# Patient Record
Sex: Male | Born: 1975 | Race: White | Hispanic: No | Marital: Married | Smoking: Never smoker
Health system: Southern US, Community
[De-identification: ages and names within clinical notes are randomized; demographics above are authoritative.]

## PROBLEM LIST (undated history)

## (undated) ENCOUNTER — Emergency Department (HOSPITAL_COMMUNITY): Discharge status: Absent without leave | Payer: 59

## (undated) DIAGNOSIS — B9681 Helicobacter pylori [H. pylori] as the cause of diseases classified elsewhere: Secondary | ICD-10-CM

## (undated) DIAGNOSIS — K859 Acute pancreatitis without necrosis or infection, unspecified: Secondary | ICD-10-CM

## (undated) DIAGNOSIS — Z87898 Personal history of other specified conditions: Secondary | ICD-10-CM

## (undated) DIAGNOSIS — J189 Pneumonia, unspecified organism: Secondary | ICD-10-CM

## (undated) DIAGNOSIS — K279 Peptic ulcer, site unspecified, unspecified as acute or chronic, without hemorrhage or perforation: Secondary | ICD-10-CM

## (undated) DIAGNOSIS — E781 Pure hyperglyceridemia: Secondary | ICD-10-CM

## (undated) DIAGNOSIS — K5792 Diverticulitis of intestine, part unspecified, without perforation or abscess without bleeding: Secondary | ICD-10-CM

## (undated) DIAGNOSIS — E785 Hyperlipidemia, unspecified: Secondary | ICD-10-CM

## (undated) DIAGNOSIS — E119 Type 2 diabetes mellitus without complications: Secondary | ICD-10-CM

## (undated) HISTORY — DX: Personal history of other specified conditions: Z87.898

## (undated) HISTORY — DX: Pneumonia, unspecified organism: J18.9

## (undated) HISTORY — DX: Type 2 diabetes mellitus without complications: E11.9

## (undated) HISTORY — DX: Hyperlipidemia, unspecified: E78.5

## (undated) HISTORY — DX: Pure hyperglyceridemia: E78.1

## (undated) HISTORY — DX: Acute pancreatitis without necrosis or infection, unspecified: K85.90

## (undated) HISTORY — PX: BACK SURGERY: SHX140

## (undated) HISTORY — PX: COLON SURGERY: SHX602

---

## 2003-06-30 ENCOUNTER — Encounter: Payer: Self-pay | Admitting: Family Medicine

## 2003-06-30 ENCOUNTER — Ambulatory Visit (HOSPITAL_COMMUNITY): Admission: RE | Admit: 2003-06-30 | Discharge: 2003-06-30 | Payer: Self-pay | Admitting: Family Medicine

## 2003-07-23 ENCOUNTER — Ambulatory Visit (HOSPITAL_COMMUNITY): Admission: RE | Admit: 2003-07-23 | Discharge: 2003-07-24 | Payer: Self-pay | Admitting: Neurosurgery

## 2007-05-25 ENCOUNTER — Emergency Department (HOSPITAL_COMMUNITY): Admission: EM | Admit: 2007-05-25 | Discharge: 2007-05-25 | Payer: Self-pay | Admitting: Emergency Medicine

## 2009-10-08 ENCOUNTER — Emergency Department (HOSPITAL_COMMUNITY): Admission: EM | Admit: 2009-10-08 | Discharge: 2009-10-09 | Payer: Self-pay | Admitting: Emergency Medicine

## 2009-10-08 ENCOUNTER — Emergency Department (HOSPITAL_COMMUNITY): Admission: EM | Admit: 2009-10-08 | Discharge: 2009-10-08 | Payer: Self-pay | Admitting: Emergency Medicine

## 2010-12-05 LAB — URINALYSIS, ROUTINE W REFLEX MICROSCOPIC
Bilirubin Urine: NEGATIVE
Bilirubin Urine: NEGATIVE
Glucose, UA: NEGATIVE mg/dL
Glucose, UA: NEGATIVE mg/dL
Hgb urine dipstick: NEGATIVE
Hgb urine dipstick: NEGATIVE
Ketones, ur: NEGATIVE mg/dL
Nitrite: NEGATIVE
Protein, ur: NEGATIVE mg/dL
Specific Gravity, Urine: 1.013 (ref 1.005–1.030)
Specific Gravity, Urine: 1.022 (ref 1.005–1.030)
Urobilinogen, UA: 0.2 mg/dL (ref 0.0–1.0)
pH: 5.5 (ref 5.0–8.0)
pH: 6 (ref 5.0–8.0)

## 2010-12-05 LAB — DIFFERENTIAL
Eosinophils Absolute: 0.1 10*3/uL (ref 0.0–0.7)
Eosinophils Absolute: 0.1 10*3/uL (ref 0.0–0.7)
Eosinophils Relative: 1 % (ref 0–5)
Lymphocytes Relative: 21 % (ref 12–46)
Lymphs Abs: 1.9 10*3/uL (ref 0.7–4.0)
Lymphs Abs: 2 10*3/uL (ref 0.7–4.0)
Monocytes Absolute: 0.5 10*3/uL (ref 0.1–1.0)
Neutrophils Relative %: 70 % (ref 43–77)

## 2010-12-05 LAB — COMPREHENSIVE METABOLIC PANEL
ALT: 29 U/L (ref 0–53)
ALT: 32 U/L (ref 0–53)
AST: 36 U/L (ref 0–37)
Albumin: 4.2 g/dL (ref 3.5–5.2)
BUN: 11 mg/dL (ref 6–23)
CO2: 29 mEq/L (ref 19–32)
Calcium: 8.8 mg/dL (ref 8.4–10.5)
Calcium: 9.7 mg/dL (ref 8.4–10.5)
Chloride: 103 mEq/L (ref 96–112)
Creatinine, Ser: 1.16 mg/dL (ref 0.4–1.5)
Creatinine, Ser: 1.24 mg/dL (ref 0.4–1.5)
GFR calc Af Amer: >60 mL/min (ref >60)
GFR calc non Af Amer: >60 mL/min (ref >60)
Glucose, Bld: 91 mg/dL (ref 70–99)
Sodium: 136 mEq/L (ref 135–145)
Sodium: 136 mEq/L (ref 135–145)
Total Bilirubin: 0.6 mg/dL (ref 0.3–1.2)

## 2010-12-05 LAB — CBC
HCT: 41.7 % (ref 39.0–52.0)
HCT: 46.6 % (ref 39.0–52.0)
Hemoglobin: 14.2 g/dL (ref 13.0–17.0)
Hemoglobin: 16 g/dL (ref 13.0–17.0)
MCHC: 34 g/dL (ref 30.0–36.0)
MCHC: 34.4 g/dL (ref 30.0–36.0)
MCV: 88.8 fL (ref 78.0–100.0)
MCV: 89.7 fL (ref 78.0–100.0)
Platelets: 187 10*3/uL (ref 150–400)
RBC: 4.66 MIL/uL (ref 4.22–5.81)
RBC: 5.25 MIL/uL (ref 4.22–5.81)
RDW: 13.6 % (ref 11.5–15.5)
WBC: 9.3 10*3/uL (ref 4.0–10.5)

## 2010-12-05 LAB — LIPASE, BLOOD: Lipase: 18 U/L (ref 11–59)

## 2011-02-03 NOTE — Op Note (Signed)
Mark Silva, Mark Silva                         ACCOUNT NO.:  [id number]   MEDICAL RECORD NO.:  [id number]                   PATIENT TYPE:  OIB   LOCATION:  3024                                 FACILITY:  [facility]   PHYSICIAN:  Cristi Loron, M.D.            DATE OF BIRTH:  1976/08/05   DATE OF PROCEDURE:  07/23/2003  DATE OF DISCHARGE:  07/24/2003                                 OPERATIVE REPORT   INDICATIONS FOR PROCEDURE:  The patient is a 35 year old white male who has  suffered from back and left leg pain. He failed medical management and was  worked with a lumbar MRI which demonstrated herniated disk in L4-5. I  discussed  the various treatment options with the patient and his wife, and  the patient weighed the  risks, benefits and alternatives to surgery and  decided to proceed with a microdiskectomy.   PREOPERATIVE DIAGNOSIS:  Left L4-5 herniated nucleus pulposus, stenosis,  degenerative disk disease, lumbar radiculopathy, lumbago.   POSTOPERATIVE DIAGNOSIS:  Left L4-5 herniated nucleus pulposus, stenosis,  degenerative disk disease, lumbar radiculopathy, lumbago.   PROCEDURE:  Left L4-5 microdiskectomy using microdissection.   SURGEON:  Cristi Loron, M.D.   ASSISTANT:  Stefani Dama, M.D.   ANESTHESIA:  General endotracheal anesthesia.   ESTIMATED BLOOD LOSS:  50 mL.   SPECIMENS:  None.   DRAINS:  None.   COMPLICATIONS:  None.   DESCRIPTION OF PROCEDURE:  The patient was brought to the operating room by  the anesthesia team. General endotracheal anesthesia was induced. The  patient was then turned to the prone position  on the Wilson frame. His  lumbosacral region was then shaved  and prepared with Betadine scrub and  Betadine solution. Sterile drapes were applied. I then injected the area to  be incised with Marcaine with epinephrine solution.   I used a scalpel to make a linear incision of the L4-5 interspace. I used  electrocautery to dissect  down to the thoracolumbar fascia. I divided  the  fascia just to the left of the midline, performing a left-sided  subperiosteal dissection. I stripped the paraspinous musculature from the  left spinous process lamina of L4 and L5. I inserted the McCollough  retractor for exposure and then obtained intraoperative an radiograph that  confirmed our  location.   We then brought the operative microscope in the field. Under its  magnification and illumination, we completed the microdissection,  decompression. We used a high-speed drill to perform a left L4 laminotomy.  We widened the laminotomy with the Kerrison punch and removed the left L4-5  ligament of Flavum, exposing the underlying  thecal sac. I then performed a  foraminotomy about the left L5 nerve root. I used microdissection to free up  the nerve root and the  thecal sac from the epidural tissue, and Dr. Ilean Skill  carefully  retracted  the neural structures medially with the D'Errico  retractor, exposing an underlying herniated disk.   I incised the herniated disk with the #15 blade scalpel and performed a  partial intravertebral diskectomy using the pituitary forceps and the  Epstein and the Scoville curets. I then used the osteophyte tool to remove  some redundant posterior longitudinal ligament from the vertebral endplates.  I then palpated along the ventral surface of the  thecal sac and along the  exit route of the left L5 nerve root and noted the neural structure was well  decompressed. I used electrocautery to achieve stringent hemostasis.   I then copiously irrigated the wound out with Bacitracin solution, removed  the solution and then removed the McCullough retractor and then  reapproximated the patient's thoracolumbar fascia with interrupted #1 Vicryl  suture, the subcutaneous tissue with interrupted 2-0 Vicryl suture and the  skin with Steri-Strips and Benzoin. The wound was then coated with  Bacitracin ointment. A  sterile dressing was applied.   The drapes were removed. The patient was subsequently returned to the supine  position  where he was extubated by the anesthesia team and transported to  the post anesthesia care unit in stable condition. All sponge, instrument  and needle counts were correct at the end of the case.                                               Cristi Loron, M.D.    JDJ/MEDQ  D:  07/23/2003  T:  07/24/2003  Job:  [id number]

## 2011-08-04 ENCOUNTER — Emergency Department (HOSPITAL_COMMUNITY): Payer: 59

## 2011-08-04 ENCOUNTER — Emergency Department (HOSPITAL_COMMUNITY)
Admission: EM | Admit: 2011-08-04 | Discharge: 2011-08-04 | Disposition: A | Discharge status: Home / Self Care | Payer: 59 | Attending: Emergency Medicine | Admitting: Emergency Medicine

## 2011-08-04 DIAGNOSIS — K5792 Diverticulitis of intestine, part unspecified, without perforation or abscess without bleeding: Secondary | ICD-10-CM

## 2011-08-04 DIAGNOSIS — K5732 Diverticulitis of large intestine without perforation or abscess without bleeding: Secondary | ICD-10-CM | POA: Insufficient documentation

## 2011-08-04 DIAGNOSIS — R1032 Left lower quadrant pain: Secondary | ICD-10-CM | POA: Insufficient documentation

## 2011-08-04 HISTORY — DX: Diverticulitis of intestine, part unspecified, without perforation or abscess without bleeding: K57.92

## 2011-08-04 LAB — URINALYSIS, ROUTINE W REFLEX MICROSCOPIC
Ketones, ur: NEGATIVE mg/dL
Leukocytes, UA: NEGATIVE
Nitrite: NEGATIVE
Protein, ur: NEGATIVE mg/dL
pH: 5.5 (ref 5.0–8.0)

## 2011-08-04 LAB — DIFFERENTIAL
Eosinophils Absolute: 0.2 10*3/uL (ref 0.0–0.7)
Eosinophils Relative: 2 % (ref 0–5)
Lymphs Abs: 2.6 10*3/uL (ref 0.7–4.0)
Monocytes Relative: 6 % (ref 3–12)

## 2011-08-04 LAB — CBC
HCT: 45.8 % (ref 39.0–52.0)
Hemoglobin: 15.7 g/dL (ref 13.0–17.0)
MCH: 29.8 pg (ref 26.0–34.0)
MCV: 87.1 fL (ref 78.0–100.0)
RBC: 5.26 MIL/uL (ref 4.22–5.81)

## 2011-08-04 LAB — COMPREHENSIVE METABOLIC PANEL
AST: 23 U/L (ref 0–37)
Albumin: 4.2 g/dL (ref 3.5–5.2)
Calcium: 10.8 mg/dL — ABNORMAL HIGH (ref 8.4–10.5)
Chloride: 100 mEq/L (ref 96–112)
Creatinine, Ser: 1.01 mg/dL (ref 0.50–1.35)
Total Protein: 7.9 g/dL (ref 6.0–8.3)

## 2011-08-04 MED ORDER — CIPROFLOXACIN HCL 500 MG PO TABS: 500.0000 mg | ORAL_TABLET | Freq: Two times a day (BID) | ORAL | Status: AC

## 2011-08-04 MED ORDER — HYDROCODONE-ACETAMINOPHEN 5-325 MG PO TABS
1.0000 | ORAL_TABLET | Freq: Once | ORAL | Status: DC
  Filled 2011-08-04: qty 1

## 2011-08-04 MED ORDER — METRONIDAZOLE 500 MG PO TABS
500.0000 mg | ORAL_TABLET | Freq: Once | ORAL | Status: AC
  Administered 2011-08-04: 500 mg via ORAL
  Filled 2011-08-04: qty 1

## 2011-08-04 MED ORDER — MORPHINE SULFATE 4 MG/ML IJ SOLN
4.0000 mg | Freq: Once | INTRAMUSCULAR | Status: AC
  Administered 2011-08-04: 4 mg via INTRAVENOUS
  Filled 2011-08-04: qty 1

## 2011-08-04 MED ORDER — HYDROCODONE-ACETAMINOPHEN 5-325 MG PO TABS: 2.0000 | ORAL_TABLET | ORAL | Status: AC | PRN

## 2011-08-04 MED ORDER — CIPROFLOXACIN HCL 250 MG PO TABS
500.0000 mg | ORAL_TABLET | Freq: Once | ORAL | Status: AC
  Administered 2011-08-04: 500 mg via ORAL
  Filled 2011-08-04: qty 2

## 2011-08-04 MED ORDER — SODIUM CHLORIDE 0.9 % IV BOLUS (SEPSIS)
1000.0000 mL | Freq: Once | INTRAVENOUS | Status: AC
  Administered 2011-08-04: 1000 mL via INTRAVENOUS

## 2011-08-04 MED ORDER — METRONIDAZOLE 500 MG PO TABS: 500.0000 mg | ORAL_TABLET | Freq: Two times a day (BID) | ORAL | Status: AC

## 2011-08-04 MED ORDER — ONDANSETRON HCL 4 MG PO TABS: 4.0000 mg | ORAL_TABLET | Freq: Four times a day (QID) | ORAL | Status: AC

## 2011-08-04 MED ORDER — IOHEXOL 300 MG/ML  SOLN
100.0000 mL | Freq: Once | INTRAMUSCULAR | Status: AC | PRN
  Administered 2011-08-04: 100 mL via INTRAVENOUS

## 2011-08-04 MED ORDER — ONDANSETRON HCL 4 MG/2ML IJ SOLN
4.0000 mg | Freq: Once | INTRAMUSCULAR | Status: AC
Start: 2011-08-04 — End: 2011-08-04
  Administered 2011-08-04: 4 mg via INTRAVENOUS
  Filled 2011-08-04: qty 2

## 2011-08-04 NOTE — ED Notes (Signed)
Pt presents with low abdominal pain since last night. Pt states he has also had a "low grade fever." Pt denies n/v/d.

## 2011-08-04 NOTE — ED Provider Notes (Signed)
History     CSN: [id number] Arrival date & time: 08/04/2011  8:23 PM   First MD Initiated Contact with Patient 08/04/11 2024      Chief Complaint  Patient presents with  . Abdominal Pain  . Fever    (Consider location/radiation/quality/duration/timing/severity/associated sxs/prior treatment) HPI Comments: Patient presents with lower abdominal pain for the past 24 hours has gradually worsening. Pain is in the suprapubic and left lower quadrant area and similar to diverticulitis that he had 2 years ago. He follows with Dr. Elnoria Howard but has not seen him recently. He denies any nausea, vomiting, diarrhea. Normal bowel movement yesterday and today. Says the pain radiates into his left testicle.  He denies any dysuria, hematuria, flank pain or back pain. He is ate a normal lunch today but states that the pain is getting more intense. No previous abdominal surgeries.  The history is provided by the patient and the spouse.    Past Medical History  Diagnosis Date  . Diverticulitis     Past Surgical History  Procedure Date  . Back surgery     No family history on file.  History  Substance Use Topics  . Smoking status: Never Smoker   . Smokeless tobacco: Not on file  . Alcohol Use: Yes     occ      Review of Systems  Constitutional: Positive for fever, activity change and appetite change.  HENT: Negative for congestion and rhinorrhea.   Eyes: Negative for visual disturbance.  Respiratory: Negative for cough, chest tightness and shortness of breath.   Cardiovascular: Negative for chest pain.  Gastrointestinal: Positive for abdominal pain. Negative for nausea, vomiting and diarrhea.  Genitourinary: Positive for testicular pain. Negative for dysuria and hematuria.  Musculoskeletal: Negative for back pain.  Skin: Negative for rash.  Neurological: Negative for weakness and headaches.    Allergies  Other  Home Medications   Current Outpatient Rx  Name Route Sig Dispense  Refill  . IBUPROFEN 200 MG PO TABS Oral Take 600 mg by mouth as needed. For pain     . CIPROFLOXACIN HCL 500 MG PO TABS Oral Take 1 tablet (500 mg total) by mouth 2 (two) times daily. 20 tablet 0  . HYDROCODONE-ACETAMINOPHEN 5-325 MG PO TABS Oral Take 2 tablets by mouth every 4 (four) hours as needed for pain. 10 tablet 0  . METRONIDAZOLE 500 MG PO TABS Oral Take 1 tablet (500 mg total) by mouth 2 (two) times daily. 20 tablet 0  . ONDANSETRON HCL 4 MG PO TABS Oral Take 1 tablet (4 mg total) by mouth every 6 (six) hours. 12 tablet 0    BP 138/87  Pulse 86  Temp(Src) 98.7 F (37.1 C) (Oral)  Resp 20  Ht 6' (1.829 m)  Wt 235 lb (106.595 kg)  BMI 31.87 kg/m2  SpO2 100%  Physical Exam  Constitutional: He is oriented to person, place, and time. He appears well-developed and well-nourished. He appears distressed.       Mildly uncomfortable  HENT:  Head: Normocephalic and atraumatic.  Mouth/Throat: Oropharynx is clear and moist. No oropharyngeal exudate.  Eyes: Conjunctivae are normal. Pupils are equal, round, and reactive to light.  Neck: Normal range of motion.  Cardiovascular: Normal rate, regular rhythm and normal heart sounds.   Pulmonary/Chest: Effort normal and breath sounds normal. No respiratory distress.  Abdominal: Soft. There is tenderness. There is guarding. There is no rebound.       Tender to palpation left lower quadrant suprapubic  area. No pain at McBurney's, no pain at Murphy's  Genitourinary: Penis normal.       No testicular tenderness no epididymal tenderness  Musculoskeletal: Normal range of motion. He exhibits no edema and no tenderness.  Neurological: He is alert and oriented to person, place, and time. No cranial nerve deficit.  Skin: Skin is warm.    ED Course  Procedures (including critical care time)  Labs Reviewed  URINALYSIS, ROUTINE W REFLEX MICROSCOPIC - Abnormal; Notable for the following:    Specific Gravity, Urine >1.030 (*)    All other  components within normal limits  COMPREHENSIVE METABOLIC PANEL - Abnormal; Notable for the following:    Calcium 10.8 (*)    All other components within normal limits  CBC  DIFFERENTIAL  LIPASE, BLOOD   Ct Abdomen Pelvis W Contrast  08/04/2011  *RADIOLOGY REPORT*  Clinical Data: Left lower quadrant pain and fever.  Diverticulitis.  CT ABDOMEN AND PELVIS WITH CONTRAST  Technique:  Multidetector CT imaging of the abdomen and pelvis was performed following the standard protocol during bolus administration of intravenous contrast.  Contrast: OMNIPAQUE IOHEXOL 300 MG/ML IV SOLN  Comparison: 10/08/2009  Findings: Mild to moderate diverticulitis is seen involving the proximal sigmoid colon.  There is no evidence of abscess or extraluminal gas.  No other fluid collections are identified.  No evidence of dilated bowel loops.  No other inflammatory process or abnormal fluid collections are identified.  Normal appendix is visualized.  No soft tissue masses or lymphadenopathy identified within the abdomen or pelvis.  Left renal cyst is stable as well as probable tiny hepatic cysts.  The abdominal parenchymal organs and gallbladder are otherwise unremarkable.  IMPRESSION:  Mild to moderate diverticulitis involving the proximal sigmoid colon.  No evidence of abscess or other complication.  Original Report Authenticated By: Danae Orleans, M.D.     1. Diverticulitis       MDM  Abdominal pain without associated symptoms similar to prior diverticulitis.  Will treat symptoms, obtain labs.  Labs reviewed and unremarkable. Patient tolerating by mouth in the department the pain is well-controlled. Diverticulitis confirmed on CT scan. No abscess or free air. He is stable for outpatient antibiotics and follow up with GI.        Glynn Octave, MD 08/04/11 2217

## 2011-09-21 ENCOUNTER — Ambulatory Visit (INDEPENDENT_AMBULATORY_CARE_PROVIDER_SITE_OTHER): Payer: 59 | Admitting: General Surgery

## 2011-09-26 ENCOUNTER — Encounter (INDEPENDENT_AMBULATORY_CARE_PROVIDER_SITE_OTHER): Payer: Self-pay | Admitting: General Surgery

## 2011-10-03 ENCOUNTER — Encounter (INDEPENDENT_AMBULATORY_CARE_PROVIDER_SITE_OTHER): Payer: Self-pay | Admitting: General Surgery

## 2011-10-03 ENCOUNTER — Ambulatory Visit (INDEPENDENT_AMBULATORY_CARE_PROVIDER_SITE_OTHER): Payer: 59 | Admitting: General Surgery

## 2011-10-03 VITALS — BP 126/84 | HR 70 | Temp 98.7°F | Ht 72.5 in | Wt 237.6 lb

## 2011-10-03 DIAGNOSIS — K5732 Diverticulitis of large intestine without perforation or abscess without bleeding: Secondary | ICD-10-CM | POA: Insufficient documentation

## 2011-10-03 NOTE — Progress Notes (Addendum)
Patient ID: Mark Silva, male   DOB: Apr 09, 1976, 36 y.o.   MRN: [id number]  Chief Complaint  Patient presents with  . Pre-op Exam    eval diverticulitis    HPI Mark Silva is a 36 y.o. male.  He is referred to me by Dr. Jeani Hawking for evaluation of recurrent diverticulitis.  The patient is otherwise healthy. He owns his own [employer]. He does not smoke.  He had his initial episode of diverticulitis on October 08, 2009. This was manifest by severe suprapubic pain but no urologic symptoms. He has never passed blood in his stool and tends to be constipated during the attacks. CT scan was done with the first episode and suggested focal inflammation of the distal sigmoid colon. This resolved after 2 days.  He had another episode on August 04, 2011.    CT scan in the suggested the inflammatory focus was in the proximal sigmoid colon. He was placed on Cipro and Flagyl and the symptoms resolved after about 7 days.  He had another episode last month that resolved but took almost 2 weeks to get well. He is now asymptomatic and his bowel movements were moving fine. Never hospitalized.   He is concerned that he had 2 episodes back to back and that they are lasting longer. He is fearful that he will have a complication such as a perforation requiring temporary colostomy.  He is scheduled for his first ever colonoscopy on October 10, 2011.  As mentioned above, he has never passed blood in his stool or urine. He has no urinary tract symptoms.  There is no family history of colorectal disease. HPI  Past Medical History  Diagnosis Date  . Diverticulitis     Past Surgical History  Procedure Date  . Back surgery     Family History  Problem Relation Age of Onset  . Heart disease Mother   . Hypertension Mother   . Heart disease Father   . Hypertension Father   . Cancer Father     lung  . Hypertension Sister   . Hypertension Brother     Social History History    Substance Use Topics  . Smoking status: Never Smoker   . Smokeless tobacco: Not on file  . Alcohol Use: Yes     occ    Allergies  Allergen Reactions  . Other Nausea And Vomiting    Green Peas, Lentils    Current Outpatient Prescriptions  Medication Sig Dispense Refill  . ibuprofen (ADVIL,MOTRIN) 200 MG tablet Take 600 mg by mouth as needed. For pain         Review of Systems Review of Systems  Constitutional: Negative for fever, chills and unexpected weight change.  HENT: Negative for hearing loss, congestion, sore throat, trouble swallowing and voice change.   Eyes: Negative for visual disturbance.  Respiratory: Negative for cough, shortness of breath and wheezing.   Cardiovascular: Negative for chest pain, palpitations and leg swelling.  Gastrointestinal: Positive for abdominal pain and constipation. Negative for nausea, vomiting, diarrhea, blood in stool, abdominal distention, anal bleeding and rectal pain.  Genitourinary: Negative for hematuria and difficulty urinating.  Musculoskeletal: Negative for arthralgias.  Skin: Negative for rash and wound.  Neurological: Negative for seizures, syncope, weakness and headaches.  Hematological: Negative for adenopathy. Does not bruise/bleed easily.  Psychiatric/Behavioral: Negative for confusion.    Blood pressure 126/84, pulse 70, temperature 98.7 F (37.1 C), temperature source Temporal, height 6' 0.5" (1.842 m), weight 237 lb 9.6  oz (107.775 kg), SpO2 98.00%.  Physical Exam Physical Exam  Constitutional: He is oriented to person, place, and time. He appears well-developed and well-nourished. No distress.  HENT:  Head: Normocephalic.  Nose: Nose normal.  Mouth/Throat: No oropharyngeal exudate.  Eyes: Conjunctivae and EOM are normal. Pupils are equal, round, and reactive to light. Right eye exhibits no discharge. Left eye exhibits no discharge. No scleral icterus.  Neck: Normal range of motion. Neck supple. No JVD present. No  tracheal deviation present. No thyromegaly present.  Cardiovascular: Normal rate, regular rhythm, normal heart sounds and intact distal pulses.   No murmur heard. Pulmonary/Chest: Effort normal and breath sounds normal. No stridor. No respiratory distress. He has no wheezes. He has no rales. He exhibits no tenderness.  Abdominal: Soft. Bowel sounds are normal. He exhibits no distension and no mass. There is no tenderness. There is no rebound and no guarding.  Musculoskeletal: Normal range of motion. He exhibits no edema and no tenderness.  Lymphadenopathy:    He has no cervical adenopathy.  Neurological: He is alert and oriented to person, place, and time. He has normal reflexes. Coordination normal.  Skin: Skin is warm and dry. No rash noted. He is not diaphoretic. No erythema. No pallor.  Psychiatric: He has a normal mood and affect. His behavior is normal. Judgment and thought content normal.    Data Reviewed I have reviewed both of his CT scans. I reviewed Dr. Lennie Odor office notes.  Assessment    Acute sigmoid diverticulitis. He has had 3 or 4 episodes, and they seemed to be accelerating in intensity and duration, without apparent complication to date. Given his young age and the recurrent nature of his attacks, I think that elective sigmoid colectomy can be considered as an option in his care.    Plan    Adhere to strict high fiber, low  fat, diet with lots of hydration.  Colonoscopy on October 10, 2011  Return to see me in one month. We will have further discussion at that time. He seems interested in elective sigmoid colectomy we will consider a barium enema preop as a roadmap for surgical intervention.  I gave him patient information booklet and lots of written information about diverticulitis and surgery.  ADDENDUM (10/17/2011): Colonoscopy performed by Dr. Elnoria Howard reveals sigmoid diverticulae.  BE shows diverticulae concentrated in sigmoid.  Patient has called twice and  requests scheduling of sigmoid colectomy. He declines further office visits.  Will schedule for sigmoid colectomy after routine bowel prep.   Angelia Mould. Derrell Lolling, M.D., [facility] General and Minimally invasive Surgery Breast and Colorectal Surgery Office:   [phone] Pager:   [phone]      10/03/2011, 3:41 PM

## 2011-10-03 NOTE — Patient Instructions (Signed)
You have had 3 or 4 episodes of acute diverticulitis of the sigmoid colon. Currently you are asymptomatic and there does not appear to be any apparent complications. I advise you to adhere to a high fiber, low fat diet with lots of processed foods. I advise you to proceed with the colonoscopy that is scheduled on January 22 with Dr. Elnoria Howard. Return to see me in one month for further discussion.  Surgery may be considered as an elective option in your case.  Diverticulitis A diverticulum is a small pouch or sac on the colon. Diverticulosis is the presence of these diverticula on the colon. Diverticulitis is the irritation (inflammation) or infection of diverticula. CAUSES  The colon and its diverticula contain bacteria. If food particles block the tiny opening to a diverticulum, the bacteria inside can grow and cause an increase in pressure. This leads to infection and inflammation and is called diverticulitis. SYMPTOMS   Abdominal pain and tenderness. Usually, the pain is located on the left side of your abdomen. However, it could be located elsewhere.   Fever.   Bloating.   Feeling sick to your stomach (nausea).   Throwing up (vomiting).   Abnormal stools.  DIAGNOSIS  Your caregiver will take a history and perform a physical exam. Since many things can cause abdominal pain, other tests may be necessary. Tests may include:  Blood tests.   Urine tests.   X-ray of the abdomen.   CT scan of the abdomen.  Sometimes, surgery is needed to determine if diverticulitis or other conditions are causing your symptoms. TREATMENT  Most of the time, you can be treated without surgery. Treatment includes:  Resting the bowels by only having liquids for a few days. As you improve, you will need to eat a low-fiber diet.   Intravenous (IV) fluids if you are losing body fluids (dehydrated).   Antibiotic medicines that treat infections may be given.   Pain and nausea medicine, if needed.   Surgery  if the inflamed diverticulum has burst.  HOME CARE INSTRUCTIONS   Try a clear liquid diet (broth, tea, or water for as long as directed by your caregiver). You may then gradually begin a low-fiber diet as tolerated. A low-fiber diet is a diet with less than 10 grams of fiber. Choose the foods below to reduce fiber in the diet:   White breads, cereals, rice, and pasta.   Cooked fruits and vegetables or soft fresh fruits and vegetables without the skin.   Ground or well-cooked tender beef, ham, veal, lamb, pork, or poultry.   Eggs and seafood.   After your diverticulitis symptoms have improved, your caregiver may put you on a high-fiber diet. A high-fiber diet includes 14 grams of fiber for every 1000 calories consumed. For a standard 2000 calorie diet, you would need 28 grams of fiber. Follow these diet guidelines to help you increase the fiber in your diet. It is important to slowly increase the amount fiber in your diet to avoid gas, constipation, and bloating.   Choose whole-grain breads, cereals, pasta, and brown rice.   Choose fresh fruits and vegetables with the skin on. Do not overcook vegetables because the more vegetables are cooked, the more fiber is lost.   Choose more nuts, seeds, legumes, dried peas, beans, and lentils.   Look for food products that have greater than 3 grams of fiber per serving on the Nutrition Facts label.   Take all medicine as directed by your caregiver.   If your  caregiver has given you a follow-up appointment, it is very important that you go. Not going could result in lasting (chronic) or permanent injury, pain, and disability. If there is any problem keeping the appointment, call to reschedule.  SEEK MEDICAL CARE IF:   Your pain does not improve.   You have a hard time advancing your diet beyond clear liquids.   Your bowel movements do not return to normal.  SEEK IMMEDIATE MEDICAL CARE IF:   Your pain becomes worse.   You have an oral  temperature above 102 F (38.9 C), not controlled by medicine.   You have repeated vomiting.   You have bloody or black, tarry stools.   Symptoms that brought you to your caregiver become worse or are not getting better.  MAKE SURE YOU:   Understand these instructions.   Will watch your condition.   Will get help right away if you are not doing well or get worse.  Document Released: 06/14/2005 Document Revised: 05/17/2011 Document Reviewed: 10/10/2010 [facility] Patient Information 2012 [employer], [place].

## 2011-10-11 ENCOUNTER — Telehealth (INDEPENDENT_AMBULATORY_CARE_PROVIDER_SITE_OTHER): Payer: Self-pay | Admitting: General Surgery

## 2011-10-11 ENCOUNTER — Other Ambulatory Visit (INDEPENDENT_AMBULATORY_CARE_PROVIDER_SITE_OTHER): Payer: Self-pay

## 2011-10-11 DIAGNOSIS — K5730 Diverticulosis of large intestine without perforation or abscess without bleeding: Secondary | ICD-10-CM

## 2011-10-11 NOTE — Telephone Encounter (Signed)
  Dr. Jeani Hawking called me today about Mark Silva. He stated that his colonoscopy was performed yesterday and showed a few sigmoid diverticula and was otherwise normal. He stated the patient wanted to discuss proceeding with surgical plans.  I called Mark Silva and left a message and he called me back later on this afternoon. He clearly wants to be considered for surgery in the near future so that he can recover in time for summer vacation. I told him that we will do a barium enema and he was fine with that.  Arline Asp has scheduled him for a barium enema without air on February 4. She is also scheduled office visit with me a few days later.  We will likely proceed with scheduling of a laparoscopic assisted sigmoid colectomy thereafter.   Angelia Mould. Derrell Lolling, M.D., [facility] General and Minimally invasive Surgery Breast and Colorectal Surgery Office:   [phone] Pager:   [phone]

## 2011-10-16 ENCOUNTER — Ambulatory Visit
Admission: RE | Admit: 2011-10-16 | Discharge: 2011-10-16 | Disposition: A | Discharge status: Home / Self Care | Payer: 59 | Source: Ambulatory Visit | Attending: General Surgery | Admitting: General Surgery

## 2011-10-16 ENCOUNTER — Telehealth (INDEPENDENT_AMBULATORY_CARE_PROVIDER_SITE_OTHER): Payer: Self-pay

## 2011-10-16 DIAGNOSIS — K5730 Diverticulosis of large intestine without perforation or abscess without bleeding: Secondary | ICD-10-CM

## 2011-10-16 NOTE — Telephone Encounter (Signed)
Pt wants to proceed with scheduling his colon surgery. Pt had BE today and does not want f/u ov. Pt wants to just proceed with surgery due to his work schedule. Please let me know if this will be ok and we will need to do surgery orders and pt aware he will need to pick up colon prep.

## 2011-10-17 ENCOUNTER — Telehealth (INDEPENDENT_AMBULATORY_CARE_PROVIDER_SITE_OTHER): Payer: Self-pay

## 2011-10-17 ENCOUNTER — Other Ambulatory Visit (INDEPENDENT_AMBULATORY_CARE_PROVIDER_SITE_OTHER): Payer: Self-pay | Admitting: General Surgery

## 2011-10-17 NOTE — Telephone Encounter (Signed)
LMOM for pt to call. Dr Derrell Lolling is placing orders in epic for pt to go ahead and schedule surgery.Pt needs to pick up colon prep.

## 2011-10-18 ENCOUNTER — Telehealth (INDEPENDENT_AMBULATORY_CARE_PROVIDER_SITE_OTHER): Payer: Self-pay

## 2011-10-18 NOTE — Telephone Encounter (Signed)
Pt notified that colon prep is at front desk for pick up.

## 2011-10-23 ENCOUNTER — Other Ambulatory Visit: Payer: 59

## 2011-10-25 ENCOUNTER — Encounter (HOSPITAL_COMMUNITY)
Admission: RE | Admit: 2011-10-25 | Discharge: 2011-10-25 | Disposition: A | Discharge status: Home / Self Care | Payer: 59 | Source: Ambulatory Visit | Attending: General Surgery | Admitting: General Surgery

## 2011-10-25 LAB — COMPREHENSIVE METABOLIC PANEL
ALT: 21 U/L (ref 0–53)
Albumin: 4.2 g/dL (ref 3.5–5.2)
Alkaline Phosphatase: 57 U/L (ref 39–117)
BUN: 15 mg/dL (ref 6–23)
Chloride: 100 mEq/L (ref 96–112)
GFR calc Af Amer: >90 mL/min (ref >90)
Glucose, Bld: 97 mg/dL (ref 70–99)
Potassium: 4 mEq/L (ref 3.5–5.1)
Sodium: 135 mEq/L (ref 135–145)
Total Bilirubin: 0.6 mg/dL (ref 0.3–1.2)
Total Protein: 7.6 g/dL (ref 6.0–8.3)

## 2011-10-25 LAB — CBC
HCT: 44.5 % (ref 39.0–52.0)
Hemoglobin: 15.9 g/dL (ref 13.0–17.0)
RBC: 5.21 MIL/uL (ref 4.22–5.81)
WBC: 5.7 10*3/uL (ref 4.0–10.5)

## 2011-10-25 LAB — URINALYSIS, ROUTINE W REFLEX MICROSCOPIC
Glucose, UA: NEGATIVE mg/dL
Hgb urine dipstick: NEGATIVE
Ketones, ur: NEGATIVE mg/dL
Protein, ur: NEGATIVE mg/dL
Urobilinogen, UA: 1 mg/dL (ref 0.0–1.0)

## 2011-10-25 LAB — DIFFERENTIAL
Basophils Relative: 1 % (ref 0–1)
Eosinophils Absolute: 0.1 10*3/uL (ref 0.0–0.7)
Eosinophils Relative: 1 % (ref 0–5)
Lymphs Abs: 1.9 10*3/uL (ref 0.7–4.0)
Monocytes Relative: 6 % (ref 3–12)
Neutrophils Relative %: 59 % (ref 43–77)

## 2011-10-25 MED ORDER — CHLORHEXIDINE GLUCONATE 4 % EX LIQD: 1.0000 "application " | Freq: Once | CUTANEOUS | Status: DC

## 2011-10-25 NOTE — Patient Instructions (Addendum)
20 [name]  10/25/2011   Your procedure is scheduled on:  11/03/11  Report to [facility] at 11:00AM.  Call this number if you have problems the morning of surgery: [phone]   Remember:   Do not eat food:After Midnight.  May have clear liquids: up to 4 Hrs. before arrival.( 6 HRS BEFORE SURGERY - 7:00 AM )  Clear liquids include soda, tea, black coffee, apple or grape juice, broth.  Take these medicines the morning of surgery with A SIP OF WATER: NONE   Do not wear jewelry, make-up or nail polish.  Do not wear lotions, powders, or perfumes.   Do not shave underarms or legs 48 hours prior to surgery (men may shave face)  Do not bring valuables to the hospital.  Contacts, dentures or bridgework may not be worn into surgery.  Leave suitcase in the car. After surgery it may be brought to your room.  For patients admitted to the hospital, checkout time is 11:00 AM the day of discharge.   Patients discharged the day of surgery will not be allowed to drive home.  Name and phone number of your driver:   Special Instructions: CHG Shower Use Special Wash: 1/2 bottle night before surgery and 1/2 bottle morning of surgery.             FOLLOW BOWEL PREP   Please read over the following fact sheets that you were given: MRSA Information  20 Carlos Malay

## 2011-11-01 ENCOUNTER — Encounter (INDEPENDENT_AMBULATORY_CARE_PROVIDER_SITE_OTHER): Payer: 59 | Admitting: General Surgery

## 2011-11-02 NOTE — H&P (Signed)
Mark Silva    MRN: [id number]   Description: 36 year old male  Provider: [name], MD  Department: [facility]   Diagnoses     Diverticulitis of colon (without mention of hemorrhage)   - Primary    562.11      Reason for Visit     Pre-op Exam    eval diverticulitis        Vitals - Last Recorded       BP Pulse Temp(Src) Ht Wt BMI    126/84  70  98.7 F (37.1 C) (Temporal)  6' 0.5" (1.842 m)  237 lb 9.6 oz (107.775 kg)  31.78 kg/m2          SpO2   98%                 Progress Notes     [name], MD Patient ID: Mark Silva, male   DOB: Jun 06, 1976, 36 y.o.   MRN: [id number]    Chief Complaint   Patient presents with   .  Pre-op Exam       eval diverticulitis      HPI Mark Silva is a 36 y.o. male.  He is referred to me by Dr. Jeani Hawking for evaluation of recurrent diverticulitis.  The patient is otherwise healthy. He owns his own [employer]. He does not smoke.  He had his initial episode of diverticulitis on October 08, 2009. This was manifest by severe suprapubic pain but no urologic symptoms. He has never passed blood in his stool and tends to be constipated during the attacks. CT scan was done with the first episode and suggested focal inflammation of the distal sigmoid colon. This resolved after 2 days.  He had another episode on August 04, 2011.    CT scan in the suggested the inflammatory focus was in the proximal sigmoid colon. He was placed on Cipro and Flagyl and the symptoms resolved after about 7 days.  He had another episode last month that resolved but took almost 2 weeks to get well. He is now asymptomatic and his bowel movements were moving fine. Never hospitalized.   He is concerned that he had 2 episodes back to back and that they are lasting longer. He is fearful that he will have a complication such as a perforation requiring temporary colostomy.  He is scheduled for his first ever colonoscopy on  October 10, 2011.  As mentioned above, he has never passed blood in his stool or urine. He has no urinary tract symptoms.  There is no family history of colorectal disease.    Past Medical History   Diagnosis  Date   .  Diverticulitis         Past Surgical History   Procedure  Date   .  Back surgery         Family History   Problem  Relation  Age of Onset   .  Heart disease  Mother     .  Hypertension  Mother     .  Heart disease  Father     .  Hypertension  Father     .  Cancer  Father         lung   .  Hypertension  Sister     .  Hypertension  Brother        Social History History   Substance Use Topics   .  Smoking status:  Never Smoker    .  Smokeless tobacco:  Not on file   .  Alcohol Use:  Yes         occ       Allergies   Allergen  Reactions   .  Other  Nausea And Vomiting       Green Peas, L[place]ils       Current Outpatient Prescriptions   Medication  Sig  Dispense  Refill   .  ibuprofen (ADVIL,MOTRIN) 200 MG tablet  Take 600 mg by mouth as needed. For pain             ROS  Constitutional: Negative for fever, chills and unexpected weight change.  HENT: Negative for hearing loss, congestion, sore throat, trouble swallowing and voice change.   Eyes: Negative for visual disturbance.  Respiratory: Negative for cough, shortness of breath and wheezing.   Cardiovascular: Negative for chest pain, palpitations and leg swelling.  Gastrointestinal: Positive for abdominal pain and constipation. Negative for nausea, vomiting, diarrhea, blood in stool, abdominal distention, anal bleeding and rectal pain.  Genitourinary: Negative for hematuria and difficulty urinating.  Musculoskeletal: Negative for arthralgias.  Skin: Negative for rash and wound.  Neurological: Negative for seizures, syncope, weakness and headaches.  Hematological: Negative for adenopathy. Does not bruise/bleed easily.  Psychiatric/Behavioral: Negative for confusion.   Blood pressure 126/84,  pulse 70, temperature 98.7 F (37.1 C), temperature source Temporal, height 6' 0.5" (1.842 m), weight 237 lb 9.6 oz (107.775 kg), SpO2 98.00%.   Physical Exam   Constitutional: He is oriented to person, place, and time. He appears well-developed and well-nourished. No distress.  HENT:   Head: Normocephalic.   Nose: Nose normal.   Mouth/Throat: No oropharyngeal exudate.  Eyes: Conjunctivae and EOM are normal. Pupils are equal, round, and reactive to light. Right eye exhibits no discharge. Left eye exhibits no discharge. No scleral icterus.  Neck: Normal range of motion. Neck supple. No JVD present. No tracheal deviation present. No thyromegaly present.  Cardiovascular: Normal rate, regular rhythm, normal heart sounds and intact distal pulses.    No murmur heard. Pulmonary/Chest: Effort normal and breath sounds normal. No stridor. No respiratory distress. He has no wheezes. He has no rales. He exhibits no tenderness.  Abdominal: Soft. Bowel sounds are normal. He exhibits no distension and no mass. There is no tenderness. There is no rebound and no guarding.  Musculoskeletal: Normal range of motion. He exhibits no edema and no tenderness.  Lymphadenopathy:    He has no cervical adenopathy.  Neurological: He is alert and oriented to person, place, and time. He has normal reflexes. Coordination normal.  Skin: Skin is warm and dry. No rash noted. He is not diaphoretic. No erythema. No pallor.  Psychiatric: He has a normal mood and affect. His behavior is normal. Judgment and thought content normal.    Data Reviewed I have reviewed both of his CT scans. I reviewed Dr. Lennie Odor office notes.   Assessment Acute sigmoid diverticulitis. He has had 3 or 4 episodes, and they seemed to be accelerating in intensity and duration, without apparent complication to date. Given his young age and the recurrent nature of his attacks, I think that elective sigmoid colectomy can be considered as an  option in his care.   Plan Adhere to strict high fiber, low  fat, diet with lots of hydration.  Colonoscopy on October 10, 2011  Return to see me in one month. We will have further discussion at that time. He seems interested in elective sigmoid  colectomy we will consider a barium enema preop as a roadmap for surgical intervention.  I gave him patient information booklet and lots of written information about diverticulitis and surgery.  ADDENDUM (10/17/2011): Colonoscopy performed by Dr. Elnoria Howard reveals sigmoid diverticulae.  BE shows diverticulae concentrated in sigmoid.  Patient has called twice and requests scheduling of sigmoid colectomy. He declines further office visits.  Will schedule for sigmoid colectomy after routine bowel prep.     Angelia Mould. Derrell Lolling, M.D., [facility] General and Minimally invasive Surgery Breast and Colorectal Surgery Office:   [phone] Pager:   [phone]

## 2011-11-03 ENCOUNTER — Encounter (HOSPITAL_COMMUNITY): Payer: Self-pay | Admitting: Anesthesiology

## 2011-11-03 ENCOUNTER — Encounter (HOSPITAL_COMMUNITY): Payer: Self-pay

## 2011-11-03 ENCOUNTER — Other Ambulatory Visit (INDEPENDENT_AMBULATORY_CARE_PROVIDER_SITE_OTHER): Payer: Self-pay | Admitting: General Surgery

## 2011-11-03 ENCOUNTER — Inpatient Hospital Stay (HOSPITAL_COMMUNITY)
Admission: RE | Admit: 2011-11-03 | Discharge: 2011-11-08 | DRG: 330 | Disposition: A | Discharge status: Home / Self Care | Payer: 59 | Source: Ambulatory Visit | Attending: General Surgery | Admitting: General Surgery

## 2011-11-03 ENCOUNTER — Encounter (HOSPITAL_COMMUNITY)
Admission: RE | Disposition: A | Discharge status: Home / Self Care | Payer: Self-pay | Source: Ambulatory Visit | Attending: General Surgery

## 2011-11-03 ENCOUNTER — Inpatient Hospital Stay (HOSPITAL_COMMUNITY): Payer: 59 | Admitting: Anesthesiology

## 2011-11-03 DIAGNOSIS — K560 Paralytic ileus: Secondary | ICD-10-CM | POA: Diagnosis not present

## 2011-11-03 DIAGNOSIS — K5732 Diverticulitis of large intestine without perforation or abscess without bleeding: Principal | ICD-10-CM | POA: Insufficient documentation

## 2011-11-03 SURGERY — COLECTOMY, SIGMOID, LAPAROSCOPIC
Anesthesia: General | Site: Abdomen | Wound class: Clean Contaminated

## 2011-11-03 MED ORDER — NEOSTIGMINE METHYLSULFATE 1 MG/ML IJ SOLN
INTRAMUSCULAR | Status: DC | PRN
  Administered 2011-11-03: 5 mg via INTRAVENOUS

## 2011-11-03 MED ORDER — HYDROMORPHONE HCL PF 1 MG/ML IJ SOLN
0.2500 mg | INTRAMUSCULAR | Status: DC | PRN
  Administered 2011-11-03 (×4): 0.5 mg via INTRAVENOUS

## 2011-11-03 MED ORDER — HEPARIN SODIUM (PORCINE) 5000 UNIT/ML IJ SOLN
5000.0000 [IU] | Freq: Three times a day (TID) | INTRAMUSCULAR | Status: DC
  Administered 2011-11-04 – 2011-11-08 (×12): 5000 [IU] via SUBCUTANEOUS
  Filled 2011-11-03 (×15): qty 1

## 2011-11-03 MED ORDER — INDIGOTINDISULFONATE SODIUM 8 MG/ML IJ SOLN
INTRAMUSCULAR | Status: DC | PRN
  Administered 2011-11-03: 5 mL via INTRAVENOUS

## 2011-11-03 MED ORDER — HYDROCODONE-ACETAMINOPHEN 5-325 MG PO TABS
1.0000 | ORAL_TABLET | ORAL | Status: DC | PRN
  Administered 2011-11-04: 2 via ORAL
  Administered 2011-11-04 (×2): 1 via ORAL
  Administered 2011-11-04: 2 via ORAL
  Administered 2011-11-05: 1 via ORAL
  Administered 2011-11-05 – 2011-11-08 (×13): 2 via ORAL
  Filled 2011-11-03 (×4): qty 2
  Filled 2011-11-03: qty 1
  Filled 2011-11-03 (×5): qty 2
  Filled 2011-11-03: qty 1
  Filled 2011-11-03 (×6): qty 2
  Filled 2011-11-03: qty 1

## 2011-11-03 MED ORDER — ACETAMINOPHEN 10 MG/ML IV SOLN
INTRAVENOUS | Status: DC | PRN
  Administered 2011-11-03: 1000 mg via INTRAVENOUS

## 2011-11-03 MED ORDER — ALVIMOPAN 12 MG PO CAPS
12.0000 mg | ORAL_CAPSULE | Freq: Two times a day (BID) | ORAL | Status: DC
  Administered 2011-11-04 – 2011-11-08 (×9): 12 mg via ORAL
  Filled 2011-11-03 (×10): qty 1

## 2011-11-03 MED ORDER — HYDROMORPHONE HCL PF 1 MG/ML IJ SOLN
INTRAMUSCULAR | Status: DC | PRN
  Administered 2011-11-03 (×2): 0.5 mg via INTRAVENOUS
  Administered 2011-11-03: 1 mg via INTRAVENOUS

## 2011-11-03 MED ORDER — PROMETHAZINE HCL 25 MG/ML IJ SOLN
INTRAMUSCULAR | Status: AC
  Filled 2011-11-03: qty 1

## 2011-11-03 MED ORDER — POTASSIUM CHLORIDE IN NACL 20-0.9 MEQ/L-% IV SOLN
INTRAVENOUS | Status: DC
  Administered 2011-11-03 – 2011-11-06 (×7): via INTRAVENOUS
  Administered 2011-11-07: 20 mL/h via INTRAVENOUS
  Filled 2011-11-03 (×11): qty 1000

## 2011-11-03 MED ORDER — LACTATED RINGERS IV SOLN: INTRAVENOUS | Status: DC

## 2011-11-03 MED ORDER — ONDANSETRON HCL 4 MG/2ML IJ SOLN
INTRAMUSCULAR | Status: DC | PRN
  Administered 2011-11-03: 4 mg via INTRAVENOUS

## 2011-11-03 MED ORDER — ACETAMINOPHEN 10 MG/ML IV SOLN
INTRAVENOUS | Status: AC
  Filled 2011-11-03: qty 100

## 2011-11-03 MED ORDER — BUPIVACAINE-EPINEPHRINE PF 0.5-1:200000 % IJ SOLN
INTRAMUSCULAR | Status: DC | PRN
  Administered 2011-11-03: 10 mL

## 2011-11-03 MED ORDER — ONDANSETRON HCL 4 MG/2ML IJ SOLN: 4.0000 mg | Freq: Four times a day (QID) | INTRAMUSCULAR | Status: DC | PRN

## 2011-11-03 MED ORDER — LIDOCAINE HCL (CARDIAC) 20 MG/ML IV SOLN
INTRAVENOUS | Status: DC | PRN
  Administered 2011-11-03: 50 mg via INTRAVENOUS

## 2011-11-03 MED ORDER — MIDAZOLAM HCL 5 MG/5ML IJ SOLN
INTRAMUSCULAR | Status: DC | PRN
  Administered 2011-11-03: 2 mg via INTRAVENOUS

## 2011-11-03 MED ORDER — HYDROMORPHONE HCL PF 1 MG/ML IJ SOLN
2.0000 mg | INTRAMUSCULAR | Status: DC | PRN
  Administered 2011-11-03 – 2011-11-08 (×33): 2 mg via INTRAVENOUS
  Filled 2011-11-03 (×22): qty 2
  Filled 2011-11-03: qty 1
  Filled 2011-11-03: qty 2
  Filled 2011-11-03: qty 1
  Filled 2011-11-03 (×9): qty 2
  Filled 2011-11-03: qty 1

## 2011-11-03 MED ORDER — HYDROMORPHONE HCL PF 1 MG/ML IJ SOLN
INTRAMUSCULAR | Status: AC
  Administered 2011-11-03: 2 mg via INTRAVENOUS
  Filled 2011-11-03: qty 1

## 2011-11-03 MED ORDER — BUPIVACAINE-EPINEPHRINE (PF) 0.5% -1:200000 IJ SOLN
INTRAMUSCULAR | Status: AC
  Filled 2011-11-03: qty 10

## 2011-11-03 MED ORDER — SODIUM CHLORIDE 0.9 % IV SOLN
INTRAVENOUS | Status: AC
  Filled 2011-11-03: qty 1

## 2011-11-03 MED ORDER — SODIUM CHLORIDE 0.9 % IV SOLN
1.0000 g | INTRAVENOUS | Status: AC
  Administered 2011-11-03: 1 g via INTRAVENOUS

## 2011-11-03 MED ORDER — HYDROMORPHONE HCL PF 1 MG/ML IJ SOLN
INTRAMUSCULAR | Status: AC
  Filled 2011-11-03: qty 1

## 2011-11-03 MED ORDER — DEXAMETHASONE SODIUM PHOSPHATE 10 MG/ML IJ SOLN
INTRAMUSCULAR | Status: DC | PRN
  Administered 2011-11-03: 5 mg via INTRAVENOUS

## 2011-11-03 MED ORDER — LACTATED RINGERS IV SOLN
INTRAVENOUS | Status: DC
  Administered 2011-11-03: 14:00:00 via INTRAVENOUS
  Administered 2011-11-03: 1000 mL via INTRAVENOUS
  Administered 2011-11-03 (×2): via INTRAVENOUS

## 2011-11-03 MED ORDER — SUFENTANIL CITRATE 50 MCG/ML IV SOLN
INTRAVENOUS | Status: DC | PRN
  Administered 2011-11-03: 10 ug via INTRAVENOUS
  Administered 2011-11-03: 15 ug via INTRAVENOUS
  Administered 2011-11-03 (×2): 10 ug via INTRAVENOUS
  Administered 2011-11-03: 5 ug via INTRAVENOUS

## 2011-11-03 MED ORDER — FENTANYL CITRATE 0.05 MG/ML IJ SOLN
INTRAMUSCULAR | Status: DC | PRN
  Administered 2011-11-03: 50 ug via INTRAVENOUS
  Administered 2011-11-03: 100 ug via INTRAVENOUS
  Administered 2011-11-03 (×2): 50 ug via INTRAVENOUS

## 2011-11-03 MED ORDER — PROPOFOL 10 MG/ML IV BOLUS
INTRAVENOUS | Status: DC | PRN
  Administered 2011-11-03: 230 mg via INTRAVENOUS

## 2011-11-03 MED ORDER — CISATRACURIUM BESYLATE 2 MG/ML IV SOLN
INTRAVENOUS | Status: DC | PRN
  Administered 2011-11-03: 4 mg via INTRAVENOUS
  Administered 2011-11-03 (×2): 2 mg via INTRAVENOUS

## 2011-11-03 MED ORDER — GLYCOPYRROLATE 0.2 MG/ML IJ SOLN
INTRAMUSCULAR | Status: DC | PRN
  Administered 2011-11-03: .6 mg via INTRAVENOUS

## 2011-11-03 MED ORDER — METOCLOPRAMIDE HCL 5 MG/ML IJ SOLN
INTRAMUSCULAR | Status: DC | PRN
  Administered 2011-11-03: 10 mg via INTRAVENOUS

## 2011-11-03 MED ORDER — ALVIMOPAN 12 MG PO CAPS
12.0000 mg | ORAL_CAPSULE | Freq: Once | ORAL | Status: AC
  Administered 2011-11-03: 12 mg via ORAL

## 2011-11-03 MED ORDER — ONDANSETRON HCL 4 MG PO TABS
4.0000 mg | ORAL_TABLET | Freq: Four times a day (QID) | ORAL | Status: DC | PRN
  Administered 2011-11-08: 4 mg via ORAL
  Filled 2011-11-03: qty 1

## 2011-11-03 MED ORDER — PROMETHAZINE HCL 25 MG/ML IJ SOLN
6.2500 mg | INTRAMUSCULAR | Status: DC | PRN
  Administered 2011-11-03: 6.25 mg via INTRAVENOUS

## 2011-11-03 MED ORDER — ROCURONIUM BROMIDE 100 MG/10ML IV SOLN
INTRAVENOUS | Status: DC | PRN
  Administered 2011-11-03: 50 mg via INTRAVENOUS

## 2011-11-03 MED ORDER — INDIGOTINDISULFONATE SODIUM 8 MG/ML IJ SOLN
INTRAMUSCULAR | Status: AC
  Filled 2011-11-03: qty 5

## 2011-11-03 MED ORDER — POTASSIUM CHLORIDE IN NACL 20-0.9 MEQ/L-% IV SOLN
INTRAVENOUS | Status: AC
  Filled 2011-11-03: qty 1000

## 2011-11-03 MED ORDER — SODIUM CHLORIDE 0.9 % IV SOLN
1.0000 g | INTRAVENOUS | Status: AC
  Administered 2011-11-04: 1 g via INTRAVENOUS
  Filled 2011-11-03: qty 1

## 2011-11-03 SURGICAL SUPPLY — 65 items
APPLIER CLIP 5 13 M/L LIGAMAX5 (MISCELLANEOUS) ×2
APPLIER CLIP ROT 10 11.4 M/L (STAPLE) ×2
APR CLP MED LRG 11.4X10 (STAPLE) ×1
BLADE EXTENDED COATED 6.5IN (ELECTRODE) ×2 IMPLANT
BLADE HEX COATED 2.75 (ELECTRODE) ×2 IMPLANT
BLADE SURG SZ10 CARB STEEL (BLADE) ×2 IMPLANT
CANISTER SUCTION 2500CC (MISCELLANEOUS) ×2 IMPLANT
CANNULA ENDOPATH XCEL 11M (ENDOMECHANICALS) ×2 IMPLANT
CELLS DAT CNTRL 66122 CELL SVR (MISCELLANEOUS) ×1 IMPLANT
CLAMP ENDO BABCK 10MM (STAPLE) ×2 IMPLANT
CLIP APPLIE 5 13 M/L LIGAMAX5 (MISCELLANEOUS) ×1 IMPLANT
CLIP APPLIE ROT 10 11.4 M/L (STAPLE) ×1 IMPLANT
CLOTH BEACON ORANGE TIMEOUT ST (SAFETY) ×2 IMPLANT
COVER MAYO STAND STRL (DRAPES) ×2 IMPLANT
DECANTER SPIKE VIAL GLASS SM (MISCELLANEOUS) ×2 IMPLANT
DEVICE TROCAR PUNCTURE CLOSURE (ENDOMECHANICALS) ×2 IMPLANT
DRAPE LAPAROSCOPIC ABDOMINAL (DRAPES) ×2 IMPLANT
DRAPE LG THREE QUARTER DISP (DRAPES) ×2 IMPLANT
DRAPE POUCH INSTRU U-SHP 10X18 (DRAPES) ×2 IMPLANT
DRAPE WARM FLUID 44X44 (DRAPE) ×2 IMPLANT
ELECT REM PT RETURN 9FT ADLT (ELECTROSURGICAL) ×2
ELECTRODE REM PT RTRN 9FT ADLT (ELECTROSURGICAL) ×1 IMPLANT
ENSEAL DEVICE STD TIP 35CM (ENDOMECHANICALS) ×2 IMPLANT
GLOVE BIOGEL PI IND STRL 7.0 (GLOVE) ×1 IMPLANT
GLOVE BIOGEL PI INDICATOR 7.0 (GLOVE) ×1
GLOVE EUDERMIC 7 POWDERFREE (GLOVE) ×2 IMPLANT
GOWN STRL NON-REIN LRG LVL3 (GOWN DISPOSABLE) ×2 IMPLANT
GOWN STRL REIN XL XLG (GOWN DISPOSABLE) ×4 IMPLANT
GRASPER LAPSCPC 5X35 EPIX (ENDOMECHANICALS) ×4 IMPLANT
HAND ACTIVATED (MISCELLANEOUS) ×2 IMPLANT
KIT BASIN OR (CUSTOM PROCEDURE TRAY) ×2 IMPLANT
LEGGING LITHOTOMY PAIR STRL (DRAPES) ×2 IMPLANT
LIGASURE IMPACT 36 18CM CVD LR (INSTRUMENTS) ×4 IMPLANT
NS IRRIG 1000ML POUR BTL (IV SOLUTION) ×2 IMPLANT
PENCIL BUTTON HOLSTER BLD 10FT (ELECTRODE) ×2 IMPLANT
RELOAD PROXIMATE 75MM BLUE (ENDOMECHANICALS) ×2 IMPLANT
RTRCTR WOUND ALEXIS 18CM MED (MISCELLANEOUS) ×2
SCISSORS LAP 5X35 DISP (ENDOMECHANICALS) ×2 IMPLANT
SET IRRIG TUBING LAPAROSCOPIC (IRRIGATION / IRRIGATOR) ×2 IMPLANT
SOLUTION ANTI FOG 6CC (MISCELLANEOUS) ×2 IMPLANT
SPONGE GAUZE 4X4 12PLY (GAUZE/BANDAGES/DRESSINGS) ×2 IMPLANT
SPONGE LAP 18X18 X RAY DECT (DISPOSABLE) ×4 IMPLANT
STAPLER PROXIMATE 75MM BLUE (STAPLE) ×2 IMPLANT
STAPLER VISISTAT 35W (STAPLE) ×2 IMPLANT
STRIP CLOSURE SKIN 1/2X4 (GAUZE/BANDAGES/DRESSINGS) ×2 IMPLANT
SUCTION POOLE TIP (SUCTIONS) ×4 IMPLANT
SUT PDS AB 1 CT1 27 (SUTURE) ×4 IMPLANT
SUT PDS AB 1 TP1 96 (SUTURE) ×4 IMPLANT
SUT SILK 2 0 (SUTURE) ×1
SUT SILK 2 0 SH CR/8 (SUTURE) ×12 IMPLANT
SUT SILK 2-0 18XBRD TIE 12 (SUTURE) ×1 IMPLANT
SUT SILK 3 0 (SUTURE) ×1
SUT SILK 3 0 SH CR/8 (SUTURE) ×6 IMPLANT
SUT SILK 3-0 18XBRD TIE 12 (SUTURE) ×1 IMPLANT
TAPE CLOTH SURG 4X10 WHT LF (GAUZE/BANDAGES/DRESSINGS) ×2 IMPLANT
TOWEL OR 17X26 10 PK STRL BLUE (TOWEL DISPOSABLE) ×4 IMPLANT
TRAY FOLEY CATH 14FRSI W/METER (CATHETERS) ×2 IMPLANT
TRAY LAP CHOLE (CUSTOM PROCEDURE TRAY) ×2 IMPLANT
TROCAR BLADELESS OPT 5 75 (ENDOMECHANICALS) ×6 IMPLANT
TROCAR XCEL BLUNT TIP 100MML (ENDOMECHANICALS) ×2 IMPLANT
TROCAR XCEL NON-BLD 11X100MML (ENDOMECHANICALS) ×2 IMPLANT
TUBING INSUFFLATION 10FT LAP (TUBING) ×2 IMPLANT
WATER STERILE IRR 1500ML POUR (IV SOLUTION) ×2 IMPLANT
YANKAUER SUCT BULB TIP 10FT TU (MISCELLANEOUS) ×2 IMPLANT
YANKAUER SUCT BULB TIP NO VENT (SUCTIONS) ×4 IMPLANT

## 2011-11-03 NOTE — Op Note (Signed)
Patient Name:           Mark Silva   Date of Surgery:        11/03/2011  Pre op Diagnosis:      Recurrent diverticulitis  Post op Diagnosis:    Recurrent diverticulitis  Procedure:                 Laparoscopic assisted sigmoid colectomy, takedown splenic flexure  Surgeon:                     Angelia Mould. Derrell Lolling, M.D., FACS  Assistant:                      Angela Adam, M.D., FACS  Operative Indications:   Mark Silva is a 36 y.o. male. He was referred to me by Dr. Jeani Hawking for evaluation of recurrent diverticulitis.  The patient is otherwise healthy.. He had his initial episode of diverticulitis on October 08, 2009. This was manifest by severe suprapubic pain but no urologic symptoms. He has never passed blood in his stool and tends to be constipated during the attacks. CT scan was done with the first episode and suggested focal inflammation of the distal sigmoid colon. This resolved after 2 days.  He had another episode on August 04, 2011. CT scan in the suggested the inflammatory focus was in the proximal sigmoid colon. He was placed on Cipro and Flagyl and the symptoms resolved after about 7 days.  He had another episode last month that resolved but took almost 2 weeks to get well. He is now asymptomatic and his bowel movements were moving fine. Never hospitalized.  He is concerned that he had 2 episodes back to back and that they are lasting longer. He is fearful that he will have a complication such as a perforation requiring temporary colostomy.he has recently had a colonoscopy and barium enema which showed moderately extensive diverticulosis, confined to the sigmoid colon. He has undergone a bowel prep at home and is brought to the operating room electively.  Operative Findings:       He had moderately extensive diverticulosis of the sigmoid colon. There was no mass in the colon. There was no obvious stricture. The splenic flexure descending colon and rectum looked normal.  Proctoscopy following the anastomosis showed no air leak.  Procedure in Detail:          Following the induction of general endotracheal anesthesia, intravenous antibiotics were given and a surgical timeout was performed. Foley catheter was inserted. The patient patient was placed in lithotomy position in rigid stirrups. The abdomen and perianal area were prepped and draped in a sterile fashion.  An 11 mm Hassan trocar was inserted in the midline just above the umbilicus using an open technique. This was held in place with a pursestring suture 0 Vicryl. Pneumoperitoneum was created. 5 mm trocars were placed in the right mid abdomen, suprapubic area, and left mid-abdomen. After surveying the entire abdomen and pelvis we mobilized the sigmoid colon, descending colon and splenic flexure. We used blunt dissection and the harmonic scalpel. We had minimal bleeding. We found that we had a long redundant sigmoid colon and once we had mobilized the splenic flexure and the descending colon we could pull this down into the pelvis quite nicely. We continued mobilization down the left pelvis. We identified and preserved the ureter on the left side. We incised the peritoneum in the mesentery on the right side and felt we  had complete mobilization of the splenic flexure, descending colon and  sigmoid. The pneumoperitoneum was released. An 8- centimeter incision was made in the midline in the suprapubic area. The fascia was incised in the midline. The abdominal cavity was entered under direct vision.A wound protector was placed.  We mobilized the sigmoid colon and found that we could bring  the entire sigmoid colon into the wound. We could see the proximal rectum and brought it into the wound. We decided to transect the rectosigmoid junction with a GIA stapling device and we transected the junction between the descending colon and sigmoid with a GIA stapling device. The sigmoid mesentery was taken down. We used the LigaSure to  divide some smaller vessels but larger vessels were clamped , divided and ligated with 2-0 silk ties. The specimen was marked with a silk suture  distally and it was sent for routine histology. We irrigated out the pelvis. There did not  appear to be any bleeding. I amputated the staple lines from the proximal and distal ends of the colon. We oriented the colon carefully to make sure it was not twisted. We created an anastomosis between the proximal and distal colon in an end to end fashion with a single layer of 2-0 silk. After the anastomosis was completed it looked very good without tension and good blood supply and good color to the colon. Dr. Michaell Cowing performed a proctoscopy and we insufflated the colon under water and there was no air-leak. He could see the anastomosis and it did not appear to be bleeding. At this point we changed our instruments and gloves and suction devices. We irrigated out the pelvis again. RWe rturned the colon and the omentum to its anatomic position. The midline fascia was closed with a running suture of #1 double-stranded PDS and the skin was closed with skin staples.  The pneumoperitoneum was reestablished. The cameras and instruments were put back in. Everything looked clean and there was no bleeding. We irrigated a little bit and everything looked fine. It should be mentioned that the anesthesia Department gave 1 amp of Indigo Carmine IV about 30 minutes prior to the end of the procedure. The dye appeared in the urine but there was no leak within the abdomen. The pneumoperitoneum was released. The fascia at the umbilicus was closed with 0 Vicryl sutures. All the incisions were closed with staples. He was taken to PACU in stable condition. EBL approximately 100 cc. Counts correct. Complications none.     Angelia Mould. Derrell Lolling, M.D., FACS General and Minimally Invasive Surgery Breast and Colorectal Surgery  11/03/2011 4:24 PM

## 2011-11-03 NOTE — Transfer of Care (Signed)
Immediate Anesthesia Transfer of Care Note  Patient: Mark Silva  Procedure(s) Performed: Procedure(s) (LRB): LAPAROSCOPIC SIGMOID COLECTOMY (N/A)  Patient Location: PACU  Anesthesia Type: General  Level of Consciousness: awake and alert   Airway & Oxygen Therapy: Patient Spontanous Breathing and Patient connected to face mask oxygen  Post-op Assessment: Report given to PACU RN and Post -op Vital signs reviewed and stable  Post vital signs: Reviewed and stable  Complications: No apparent anesthesia complications

## 2011-11-03 NOTE — Anesthesia Preprocedure Evaluation (Signed)
Anesthesia Evaluation  Patient identified by MRN, date of birth, ID band Patient awake    Reviewed: Allergy & Precautions, H&P , NPO status , Patient's Chart, lab work & pertinent test results  Airway Mallampati: II TM Distance: >3 FB Neck ROM: full    Dental No notable dental hx.    Pulmonary neg pulmonary ROS,  clear to auscultation  Pulmonary exam normal       Cardiovascular Exercise Tolerance: Good neg cardio ROS regular Normal    Neuro/Psych Negative Neurological ROS  Negative Psych ROS   GI/Hepatic negative GI ROS, Neg liver ROS,   Endo/Other  Negative Endocrine ROS  Renal/GU negative Renal ROS  Genitourinary negative   Musculoskeletal   Abdominal   Peds  Hematology negative hematology ROS (+)   Anesthesia Other Findings   Reproductive/Obstetrics negative OB ROS                           Anesthesia Physical Anesthesia Plan  ASA: I  Anesthesia Plan: General   Post-op Pain Management:    Induction:   Airway Management Planned: Oral ETT  Additional Equipment:   Intra-op Plan:   Post-operative Plan: Extubation in OR  Informed Consent: I have reviewed the patients History and Physical, chart, labs and discussed the procedure including the risks, benefits and alternatives for the proposed anesthesia with the patient or authorized representative who has indicated his/her understanding and acceptance.   Dental Advisory Given  Plan Discussed with: CRNA and Surgeon  Anesthesia Plan Comments:         Anesthesia Quick Evaluation

## 2011-11-03 NOTE — Interval H&P Note (Signed)
History and Physical Interval Note:  11/03/2011 1:15 PM  Mark Silva  has presented today for surgery, with the diagnosis of diverticulitis   The goals of treatment and the various methods of treatment have been discussed with the patient and family. After consideration of risks, benefits and other options for treatment, the patient has consented to  Procedure(s) (LRB): LAPAROSCOPIC SIGMOID COLECTOMY, POSSIBLE OPEN  (N/A) as a surgical intervention .  The patients' history has been reviewed, patient examined, no change in status, stable for surgery. I have discussed complications and risks in detail.  I have reviewed the patients' chart and labs.  Questions were answered to the patient's satisfaction.     [name]

## 2011-11-03 NOTE — Anesthesia Postprocedure Evaluation (Signed)
  Anesthesia Post-op Note  Patient: Mark Silva  Procedure(s) Performed: Procedure(s) (LRB): LAPAROSCOPIC SIGMOID COLECTOMY (N/A)  Patient Location: PACU  Anesthesia Type: General  Level of Consciousness: oriented and sedated  Airway and Oxygen Therapy: Patient Spontanous Breathing and Patient connected to nasal cannula oxygen  Post-op Pain: mild  Post-op Assessment: Post-op Vital signs reviewed, Patient's Cardiovascular Status Stable, Respiratory Function Stable and Patent Airway  Post-op Vital Signs: stable  Complications: No apparent anesthesia complications

## 2011-11-04 LAB — BASIC METABOLIC PANEL
BUN: 10 mg/dL (ref 6–23)
CO2: 26 mEq/L (ref 19–32)
Calcium: 8.8 mg/dL (ref 8.4–10.5)
Chloride: 101 mEq/L (ref 96–112)
Creatinine, Ser: 0.79 mg/dL (ref 0.50–1.35)
Glucose, Bld: 114 mg/dL — ABNORMAL HIGH (ref 70–99)

## 2011-11-04 LAB — CBC
HCT: 41.5 % (ref 39.0–52.0)
Hemoglobin: 14.5 g/dL (ref 13.0–17.0)
MCH: 30.3 pg (ref 26.0–34.0)
MCV: 86.6 fL (ref 78.0–100.0)
RBC: 4.79 MIL/uL (ref 4.22–5.81)
WBC: 11 10*3/uL — ABNORMAL HIGH (ref 4.0–10.5)

## 2011-11-04 NOTE — Progress Notes (Signed)
Patient and wife instructed on use and purpose of incentive spirometer. Patient and wife verbalized understanding. CLum Keas

## 2011-11-04 NOTE — Progress Notes (Addendum)
1 Day Post-Op  Subjective: Stable and alert. Was a little nauseated last night but that has resolved. No difficulty breathing. Has ambulated in the room.Pain control good. Good urine output.  Objective: Vital signs in last 24 hours: Temp:  [97 F (36.1 C)-98.6 F (37 C)] 98.6 F (37 C) (02/16 0500) Pulse Rate:  [63-104] 79  (02/16 0500) Resp:  [11-20] 18  (02/16 0500) BP: (115-148)/(66-85) 115/69 mmHg (02/16 0500) SpO2:  [94 %-100 %] 94 % (02/16 0500) Weight:  [235 lb (106.595 kg)] 235 lb (106.595 kg) (02/15 1749) Last BM Date: 11/02/11  Intake/Output from previous day: 02/15 0701 - 02/16 0700 In: 4900 [I.V.:4900] Out: 1600 [Urine:1525; Blood:75] Intake/Output this shift:    General appearance: alert Resp: clear to auscultation bilaterally GI: abdomen is soft. Minimal bowel sounds. Dressings are clean and dry. Appropriate incisional tenderness.  Lab Results:  Results for orders placed during the hospital encounter of 11/03/11 (from the past 24 hour(s))  CBC     Status: Abnormal   Collection Time   11/04/11  4:01 AM      Component Value Range   WBC 11.0 (*) 4.0 - 10.5 (K/uL)   RBC 4.79  4.22 - 5.81 (MIL/uL)   Hemoglobin 14.5  13.0 - 17.0 (g/dL)   HCT 16.1  09.6 - 04.5 (%)   MCV 86.6  78.0 - 100.0 (fL)   MCH 30.3  26.0 - 34.0 (pg)   MCHC 34.9  30.0 - 36.0 (g/dL)   RDW 40.9  81.1 - 91.4 (%)   Platelets 172  150 - 400 (K/uL)  BASIC METABOLIC PANEL     Status: Abnormal   Collection Time   11/04/11  4:01 AM      Component Value Range   Sodium 135  135 - 145 (mEq/L)   Potassium 4.1  3.5 - 5.1 (mEq/L)   Chloride 101  96 - 112 (mEq/L)   CO2 26  19 - 32 (mEq/L)   Glucose, Bld 114 (*) 70 - 99 (mg/dL)   BUN 10  6 - 23 (mg/dL)   Creatinine, Ser 7.82  0.50 - 1.35 (mg/dL)   Calcium 8.8  8.4 - 95.6 (mg/dL)   GFR calc non Af Amer >90  >90 (mL/min)   GFR calc Af Amer >90  >90 (mL/min)     Studies/Results: @RISRSLT24 @     . 0.9 % NaCl with KCl 20 mEq / L      . alvimopan   12 mg Oral Once  . alvimopan  12 mg Oral BID  . ertapenem  1 g Intravenous 60 min Pre-Op  . ertapenem (INVANZ) IV  1 g Intravenous Q24H  . heparin  5,000 Units Subcutaneous Q8H  . HYDROmorphone      . promethazine         Assessment/Plan: s/p Procedure(s): LAPAROSCOPIC SIGMOID COLECTOMY  Stable. Begin clear liquids. Entereg protocol. Remove Foley tomorrow. Ambulate in the hall .    LOS: 1 day    Vernisha Bacote M. Derrell Lolling, M.D., [facility] General and Minimally invasive Surgery Breast and Colorectal Surgery Office:   [phone] Pager:   [phone]  11/04/2011  . .prob

## 2011-11-05 NOTE — Progress Notes (Signed)
Bladder scanned per patient's request. 0cc found in bladder.

## 2011-11-05 NOTE — Progress Notes (Signed)
2 Days Post-Op  Subjective: Patient states that he is feeling better. No more nausea. Tolerating clear liquids. No flatus or stool yet. Foley removed this morning and he has already voided wants. He is ambulating some.  Objective: Vital signs in last 24 hours: Temp:  [98.4 F (36.9 C)-99.2 F (37.3 C)] 99.2 F (37.3 C) (02/17 0530) Pulse Rate:  [60-78] 78  (02/17 0530) Resp:  [16-18] 18  (02/17 0530) BP: (116-134)/(69-82) 132/82 mmHg (02/17 0530) SpO2:  [91 %-99 %] 95 % (02/17 0530) Last BM Date: 11/02/11  Intake/Output from previous day: 02/16 0701 - 02/17 0700 In: 1980 [P.O.:780; I.V.:1200] Out: 1900 [Urine:1900] Intake/Output this shift:    General appearance: alert GI: abdomen is soft. Minimal bowel sounds. Borderline distended. Minimally tender. Wounds and wound dressings are dry and clean.  Lab Results:  No results found for this or any previous visit (from the past 24 hour(s)).   Studies/Results: @RISRSLT24 @     . alvimopan  12 mg Oral BID  . ertapenem (INVANZ) IV  1 g Intravenous Q24H  . heparin  5,000 Units Subcutaneous Q8H     Assessment/Plan: s/p Procedure(s): LAPAROSCOPIC SIGMOID COLECTOMY  Stable. Await resolution of ileus. Continue clear liquid diet. Entereg protocol Ambulate more. Check pathology.     LOS: 2 days    Rabab Currington M 11/05/2011  . .prob

## 2011-11-06 NOTE — Progress Notes (Signed)
3 Days Post-Op  Subjective: Feeling better. Passing flatus, but no stool. Tolerating clear liquids without nausea. Ambulating in halls. Voiding reasonably well although strains a little bit. Excellent urine output.  Objective: Vital signs in last 24 hours: Temp:  [97.7 F (36.5 C)-98.9 F (37.2 C)] 97.7 F (36.5 C) (02/18 0502) Pulse Rate:  [62-81] 62  (02/18 0502) Resp:  [18-20] 18  (02/18 0502) BP: (122-136)/(68-79) 130/79 mmHg (02/18 0502) SpO2:  [95 %-97 %] 97 % (02/18 0502) Last BM Date: 11/02/11  Intake/Output from previous day: 02/17 0701 - 02/18 0700 In: 3260 [P.O.:760; I.V.:2500] Out: 2875 [Urine:2875] Intake/Output this shift: Total I/O In: 1200 [I.V.:1200] Out: 2000 [Urine:2000]  General appearance: alert Resp: clear to auscultation bilaterally GI: abdomen is soft with minimal incisional tenderness. Wound and dressings are clean and dry. Bowel sounds are active. Does not seem distended.  Lab Results:  No results found for this or any previous visit (from the past 24 hour(s)).   Studies/Results: @RISRSLT24 @     . alvimopan  12 mg Oral BID  . heparin  5,000 Units Subcutaneous Q8H     Assessment/Plan:  POD#3  LAPAROSCOPIC SIGMOID COLECTOMY   Ileus resolving. Advance to full liquid diet.  Decrease IV rate. Wound care. Continue Entereg Check pathology.    LOS: 3 days    Anan Dapolito M. Derrell Lolling, M.D., [facility] General and Minimally invasive Surgery Breast and Colorectal Surgery Office:   [phone] Pager:   [phone]  11/06/2011  . .prob

## 2011-11-07 MED ORDER — BISACODYL 10 MG RE SUPP
10.0000 mg | Freq: Once | RECTAL | Status: AC
  Administered 2011-11-07: 10 mg via RECTAL
  Filled 2011-11-07: qty 1

## 2011-11-07 NOTE — Progress Notes (Signed)
4 Days Post-Op  Subjective: Feels well. No new complaints. Tolerating full liquids.  Voiding normal amounts. Passing some flatus. Passed a little mucus but no real stool. No nausea or cramps. Ambulating. Showered.  Pathology report pending  Objective: Vital signs in last 24 hours: Temp:  [97.6 F (36.4 C)-97.9 F (36.6 C)] 97.7 F (36.5 C) (02/19 0645) Pulse Rate:  [63-70] 70  (02/19 0645) Resp:  [18-20] 18  (02/19 0645) BP: (115-142)/(73-86) 128/75 mmHg (02/19 0645) SpO2:  [98 %-100 %] 100 % (02/19 0645) Last BM Date: 11/02/11  Intake/Output from previous day: 02/18 0701 - 02/19 0700 In: 600 [P.O.:600] Out: 3500 [Urine:3500] Intake/Output this shift: Total I/O In: -  Out: 2500 [Urine:2500]  Exam: General appearance: alert GI: abdomen is soft. Nondistended. Active bowel sounds. Wounds are clean.  Lab Results:  No results found for this or any previous visit (from the past 24 hour(s)).   Studies/Results: @RISRSLT24 @     . alvimopan  12 mg Oral BID  . bisacodyl  10 mg Rectal Once  . heparin  5,000 Units Subcutaneous Q8H     Assessment/Plan:  POD#4   LAPAROSCOPIC SIGMOID COLECTOMY  Ileus resolving. Await return to normal bowel function. Advanced to low-residue diet. Decrease IV rate. Dulcolax suppository  X 1 Prune-juice Check pathology.   LOS: 4 days    Phala Schraeder M. Derrell Lolling, M.D., [facility] General and Minimally invasive Surgery Breast and Colorectal Surgery Office:   [phone] Pager:   [phone]  11/07/2011  . .prob

## 2011-11-08 MED ORDER — HYDROCODONE-ACETAMINOPHEN 5-325 MG PO TABS: 1.0000 | ORAL_TABLET | ORAL | Status: AC | PRN

## 2011-11-08 NOTE — Progress Notes (Signed)
5 Days Post-Op  Subjective: Patient has had 2 or 3 loose stools and feels much better. Tolerating regular diet. No nausea. Pain is less. Voiding is markedly improved. He wants to go home.  Objective: Vital signs in last 24 hours: Temp:  [97.7 F (36.5 C)-98.1 F (36.7 C)] 97.8 F (36.6 C) (02/19 2200) Pulse Rate:  [62-75] 75  (02/19 2200) Resp:  [18] 18  (02/19 2200) BP: (115-128)/(74-76) 115/74 mmHg (02/19 2200) SpO2:  [96 %-100 %] 97 % (02/19 2200) Last BM Date: 11/07/11  Intake/Output from previous day: 02/19 0701 - 02/20 0700 In: 720 [P.O.:720] Out: 925 [Urine:925] Intake/Output this shift:    General appearance: alert Abdomen: Soft. Nondistended. Nontender. Wounds clean. Active bowel sounds. Benign exam  Lab Results:  No results found for this or any previous visit (from the past 24 hour(s)).   Studies/Results: @RISRSLT24 @     . alvimopan  12 mg Oral BID  . bisacodyl  10 mg Rectal Once  . heparin  5,000 Units Subcutaneous Q8H     Assessment/Plan: s/p Procedure(s): POD#5   LAPAROSCOPIC SIGMOID COLECTOMY  Doing well. Bowel function has resolved. Ready for discharge. Discharge home today. Diet and activities discussed. Prescription for hydrocodone given to patient. Return to office in one week for wound check and staple removal.   Angelia Mould. Derrell Lolling, M.D., [facility] General and Minimally invasive Surgery Breast and Colorectal Surgery Office:   [phone] Pager:   [phone]   LOS: 5 days    Favio Moder M 11/08/2011  . .prob

## 2011-11-08 NOTE — Discharge Summary (Signed)
Patient ID: Mark Silva [id number] 35 y.o. 1975/12/01  11/03/2011  Discharge date and time: Feb. 20, 2013  Admitting Physician: [name]  Discharge Physician: [name]  Admission Diagnoses: diverticulitis   Discharge Diagnoses: diverticulitis  Operations: Procedure(s): LAPAROSCOPIC SIGMOID COLECTOMY  Admission Condition: good  Discharged Condition: good  Indication for Admission: this 36 year old gentleman had an initial episode of diverticulitis on 10/08/2005. He did not have any blood in his stool but was constipated. CT scan at that time suggested focal inflammation of the distal sigmoid colon. This was treated as an outpatient and resolved. Another episode occurred on 08/04/2011. At that time CT scan suggested inflammatory focus was in the proximal sigmoid colon. He was again treated as an outpatient with antibiotics and bowel rest and his symptoms resolved within a week. Another episode occurred in January, again was treated as an outpatient but took almost 2 weeks to get well. He is concerned about the accelerating frequency of his attacks, and he is afraid he will have a complication such as perforation requiring colostomy. He has no voiding symptoms. Recent barium enema and colonoscopy showed that his diverticular disease is mostly confined to the sigmoid colon. He was strongly motivated towards the option of elective sigmoid colectomy. He has undergone bowel prep at home and is brought to the hospital electively.  Hospital Course: on the day of admission the patient was taken to the operating room and underwent elective laparoscopic-assisted sigmoid colectomy. We removed about 25 cm of sigmoid colon. This was basically the majority of sigmoid colon. It did not appear to be a disease proximal or distal to this. The distal anastomosis was placed in the proximal rectum just at the level of the sacral promontory. This was a single-layer handsewn anastomosis.  Final  pathology report showed diverticulosis and diverticulitis.  Postoperatively the patient did well. He had an ileus for a couple days. Foley catheter was removed uneventfully. He resumed ambulation uneventfully. We advanced his diet slowly until he began having loose stools. For the 24 hours prior to discharge he had 2 or 3 loose stools and felt much better.  He is discharged on 11/08/2011. At this point in time his abdomen is soft and nontender in all the wounds are healing without complication. Skin staples were left in place.  He was given instructions in diet and activities. He is given a prescription for Vicodin for pain. He is asked to return to the office in 5-7 days for staple removal. He is instructed in high fiber, low fat diet.   Consults: None  Significant Diagnostic Studies: pathology  Treatments: surgery: laparoscopic sigmoid colectomy. Disposition: Home  Patient Instructions:   Mark Silva, Mark Silva  Home Medication Instructions [id number]   Printed on:11/08/11 0540  Medication Information                    ibuprofen (ADVIL,MOTRIN) 200 MG tablet Take 400-600 mg by mouth every 6 (six) hours as needed. For pain           OVER THE COUNTER MEDICATION Take 1-2 capsules by mouth 2 (two) times daily. OxyELITE Pro  2 capsules in the morning and 1 capsules in the evening.             Activity: no lifting or sports for one month. No driving for one week. Diet: low fat, low cholesterol diet Wound Care: as directed  Follow-up:  With Dr. Derrell Lolling in 1 week.  Signed: Angelia Mould. Derrell Lolling, M.D., FACS General and minimally invasive  surgery Breast and Colorectal Surgery  11/08/2011, 5:40 AM

## 2011-11-09 ENCOUNTER — Telehealth (INDEPENDENT_AMBULATORY_CARE_PROVIDER_SITE_OTHER): Payer: Self-pay

## 2011-11-09 NOTE — Telephone Encounter (Signed)
Dr. Gerrit Friends wrote a Rx for Percocet 5/325 #30 for patient.  Wife will p/u today.

## 2011-11-09 NOTE — Telephone Encounter (Signed)
Pt's wife called to report that his pain is not controlled with the Vicodin 5/325 and Ibuprofen.  Surgery was on 2/15.  No fever or nausea.  Can we prescribe a stronger pain medication for him?

## 2011-11-15 ENCOUNTER — Encounter (INDEPENDENT_AMBULATORY_CARE_PROVIDER_SITE_OTHER): Payer: 59

## 2011-11-16 ENCOUNTER — Encounter (INDEPENDENT_AMBULATORY_CARE_PROVIDER_SITE_OTHER): Payer: 59

## 2011-11-23 ENCOUNTER — Encounter (HOSPITAL_COMMUNITY): Payer: Self-pay | Admitting: Emergency Medicine

## 2011-11-23 ENCOUNTER — Emergency Department (HOSPITAL_COMMUNITY): Payer: 59

## 2011-11-23 ENCOUNTER — Emergency Department (HOSPITAL_COMMUNITY)
Admission: EM | Admit: 2011-11-23 | Discharge: 2011-11-24 | Disposition: A | Discharge status: Home / Self Care | Payer: 59 | Attending: Emergency Medicine | Admitting: Emergency Medicine

## 2011-11-23 DIAGNOSIS — R509 Fever, unspecified: Secondary | ICD-10-CM | POA: Insufficient documentation

## 2011-11-23 DIAGNOSIS — J189 Pneumonia, unspecified organism: Secondary | ICD-10-CM | POA: Insufficient documentation

## 2011-11-23 DIAGNOSIS — Z9889 Other specified postprocedural states: Secondary | ICD-10-CM | POA: Insufficient documentation

## 2011-11-23 DIAGNOSIS — R109 Unspecified abdominal pain: Secondary | ICD-10-CM | POA: Insufficient documentation

## 2011-11-23 LAB — CBC
HCT: 41.2 % (ref 39.0–52.0)
Hemoglobin: 14.9 g/dL (ref 13.0–17.0)
MCV: 85.1 fL (ref 78.0–100.0)
Platelets: 187 10*3/uL (ref 150–400)
RBC: 4.84 MIL/uL (ref 4.22–5.81)
WBC: 9.4 10*3/uL (ref 4.0–10.5)

## 2011-11-23 LAB — URINALYSIS, ROUTINE W REFLEX MICROSCOPIC
Glucose, UA: NEGATIVE mg/dL
Ketones, ur: NEGATIVE mg/dL
Leukocytes, UA: NEGATIVE
pH: 5.5 (ref 5.0–8.0)

## 2011-11-23 LAB — POCT I-STAT, CHEM 8
BUN: 15 mg/dL (ref 6–23)
Calcium, Ion: 1.19 mmol/L (ref 1.12–1.32)
Chloride: 104 mEq/L (ref 96–112)
Sodium: 140 mEq/L (ref 135–145)

## 2011-11-23 LAB — DIFFERENTIAL
Eosinophils Relative: 2 % (ref 0–5)
Lymphocytes Relative: 23 % (ref 12–46)
Lymphs Abs: 2.1 10*3/uL (ref 0.7–4.0)
Monocytes Relative: 7 % (ref 3–12)

## 2011-11-23 NOTE — ED Notes (Signed)
Patient transported to X-ray 

## 2011-11-23 NOTE — ED Notes (Signed)
Pt presented to the ER with c/o fever and post op problem, pt states he had a surgery, colon, wound appears to be healing well, no redness or drainage in that area, pt was told by pt's surgeon to check in and evaluate for possible infection.

## 2011-11-23 NOTE — ED Provider Notes (Signed)
History     CSN: [id number]  Arrival date & time 11/23/11  1949   First MD Initiated Contact with Patient 11/23/11 2136      Chief Complaint  Patient presents with  . Fever  . Post-op Problem    (Consider location/radiation/quality/duration/timing/severity/associated sxs/prior treatment) HPI Comments: Patient had an open abdominal surgery for diverticular disease, on February 15 has been doing well.  Discharge on February 20.  He was at work today when he noticed that he had some myalgias and low-grade fever without any other, symptoms.  He's been eating well.  No nausea, vomiting, diarrhea, constipation.  He has been moving his bowels without difficulty.  He did call his surgeon tonight who recommended he come to the emergency room just to have some blood work to make sure that there was no brewing infection  The history is provided by the patient.    Past Medical History  Diagnosis Date  . Diverticulitis     Past Surgical History  Procedure Date  . Back surgery   . Colon surgery     Family History  Problem Relation Age of Onset  . Heart disease Mother   . Hypertension Mother   . Heart disease Father   . Hypertension Father   . Cancer Father     lung  . Hypertension Sister   . Hypertension Brother     History  Substance Use Topics  . Smoking status: Never Smoker   . Smokeless tobacco: Not on file  . Alcohol Use: Yes     occ      Review of Systems  Constitutional: Positive for fever. Negative for chills and appetite change.  HENT: Negative for congestion and rhinorrhea.   Respiratory: Negative for cough and shortness of breath.   Gastrointestinal: Negative for nausea, vomiting, abdominal pain, diarrhea and constipation.  Skin: Positive for wound.  Neurological: Negative for dizziness and weakness.    Allergies  Other  Home Medications   Current Outpatient Rx  Name Route Sig Dispense Refill  . IBUPROFEN 200 MG PO TABS Oral Take 600 mg by mouth every  6 (six) hours as needed. For pain      BP 131/76  Pulse 89  Temp(Src) 100.1 F (37.8 C) (Oral)  Resp 14  SpO2 96%  Physical Exam  Constitutional: He is oriented to person, place, and time. He appears well-developed and well-nourished.  HENT:  Head: Normocephalic.  Eyes: Pupils are equal, round, and reactive to light.  Neck: Normal range of motion.  Cardiovascular: Normal rate.   Abdominal: Soft. Bowel sounds are normal. He exhibits no distension. There is no tenderness.       Well he all central low abdominal suture line without surrounding erythema or signs of infection  Neurological: He is alert and oriented to person, place, and time.  Skin: Skin is warm.    ED Course  Procedures (including critical care time)  Labs Reviewed  CBC - Abnormal; Notable for the following:    MCHC 36.2 (*)    All other components within normal limits  URINALYSIS, ROUTINE W REFLEX MICROSCOPIC - Abnormal; Notable for the following:    APPearance CLOUDY (*)    All other components within normal limits  POCT I-STAT, CHEM 8 - Abnormal; Notable for the following:    Glucose, Bld 109 (*)    All other components within normal limits  DIFFERENTIAL   Ct Abdomen Pelvis W Contrast  11/24/2011  *RADIOLOGY REPORT*  Clinical Data: Postoperative fever  abdominal pain, recent colon resection for diverticulitis  CT ABDOMEN AND PELVIS WITH CONTRAST  Technique:  Multidetector CT imaging of the abdomen and pelvis was performed following the standard protocol during bolus administration of intravenous contrast. Sagittal and coronal MPR images reconstructed from axial data set.  Contrast: OMNIPAQUE IOHEXOL 300 MG/ML IJ SOLN Dilute oral contrast.  Comparison: 08/04/2011 Correlation:  Abdominal radiographs 11/23/2011  Findings: Focus of infiltrate in the right lower lobe. Several tiny nonspecific low attenuation foci noted in liver, too small to characterize but unchanged since prior study. Left renal cyst 4.1 x 3.9  cm image 41. Remainder of liver, spleen, pancreas, kidneys, and adrenal glands normal appearance.  Normal appendix. Focal area of wall thickening at the sigmoid colon question site of prior surgery. Sigmoid diverticula seen on preceding exam no longer identified post resection. Tiny umbilical hernia containing fat. A small bowel loop in the left mid abdomen shows a short segment of intussusception with questionable wall thickening at the site. Small bowel loops proximal to the the focus of intussusception are minimally distended though showing no gross evidence of obstruction, with oral contrast passed beyond into mid small bowel.  Stomach and remaining bowel loops normal appearance. No mass, adenopathy, or free fluid. Mild stranding in the sigmoid mesocolon noted likely related to surgery. Unremarkable bladder and ureters. No acute osseous findings.  IMPRESSION: Mild wall thickening at sigmoid colon with adjacent infiltration and sigmoid mesocolon likely reflecting site of colocolic anastomosis post resection of sigmoid diverticulosis. Short segment of small bowel intussusception in the left mid abdomen with question mild bowel wall thickening at site. Small bowel loops proximal to the intussusception show minimal distention though contrast passes beyond into the mid small bowel without definite evidence of obstruction. No evidence of intra abdominal abscess or acute inflammatory process.  Left renal cyst. Right lower lobe infiltrate consistent with pneumonia.  Original Report Authenticated By: Lollie Marrow, M.D.   Dg Abd Acute W/chest  11/23/2011  *RADIOLOGY REPORT*  Clinical Data: Fever, 3 weeks post partial colectomy, pain and tenderness at surgical site  ACUTE ABDOMEN SERIES (ABDOMEN 2 VIEW & CHEST 1 VIEW)  Comparison: 10/08/2009  Findings: Normal heart size, mediastinal contours, and pulmonary vascularity. Lungs clear. No pleural effusion or pneumothorax.  Minimally dilated small bowel loops in left mid  abdomen. Gas and stool throughout colon to rectum. Mild bowel wall thickening at a single small bowel loop in the left mid abdomen. Retained contrast in the appendix. No free intraperitoneal air. No urinary tract calcification or acute osseous finding.  IMPRESSION: Dilated small bowel loops in the left mid abdomen, one of which shows bowel wall thickening. Findings could reflect ileus or early versus partial small bowel obstruction. Consider CT imaging with IV and oral contrast if clinically indicated.  Original Report Authenticated By: Lollie Marrow, M.D.     1. Community acquired pneumonia     CT scan reviewed, revealing a left lower lobe pneumonia and a concern for an intussusception.  Postoperatively.  I discussed this with Dr. Rubin Payor, who will consult with central air on the surgery for further guidance in caring for this patient 3:15 AM antibiotic started for the left lower lobe pneumonia, which is an atypical finding and unexpected, which explains the delay in antibiotic treatment MDM  Will obtain CBC i-STAT, and urine per patient's request to rule out infection Discussed CT findings with Dr. Ezzard Standing, who recommended he be treated for his pneumonia that the patient called Dr. Derrell Lolling.  Later today to arrange for followup next week.  He does not feel that the finding of "intussusception is significant" requiring hospitalization, but patient is to return if he develops abdominal pain, nausea, vomiting, diarrhea, or constipation       Arman Filter, NP 11/23/11 2150  Arman Filter, NP 11/24/11 0308  Arman Filter, NP 11/24/11 [phone]

## 2011-11-23 NOTE — ED Notes (Signed)
ION:GE95<MW> Expected date:<BR> Expected time:<BR> Means of arrival:<BR> Comments:<BR> CLEANING

## 2011-11-24 ENCOUNTER — Telehealth (INDEPENDENT_AMBULATORY_CARE_PROVIDER_SITE_OTHER): Payer: Self-pay

## 2011-11-24 ENCOUNTER — Emergency Department (HOSPITAL_COMMUNITY): Payer: 59

## 2011-11-24 MED ORDER — ACETAMINOPHEN 325 MG PO TABS
650.0000 mg | ORAL_TABLET | Freq: Once | ORAL | Status: AC
  Administered 2011-11-24: 650 mg via ORAL
  Filled 2011-11-24: qty 2

## 2011-11-24 MED ORDER — AZITHROMYCIN 250 MG PO TABS: 250.0000 mg | ORAL_TABLET | Freq: Every day | ORAL | Status: AC

## 2011-11-24 MED ORDER — IOHEXOL 300 MG/ML  SOLN
100.0000 mL | Freq: Once | INTRAMUSCULAR | Status: AC | PRN
  Administered 2011-11-24: 100 mL via INTRAVENOUS

## 2011-11-24 MED ORDER — AZITHROMYCIN 250 MG PO TABS
500.0000 mg | ORAL_TABLET | Freq: Once | ORAL | Status: AC
  Administered 2011-11-24: 500 mg via ORAL
  Filled 2011-11-24: qty 2

## 2011-11-24 MED ORDER — DEXTROSE 5 % IV SOLN
1.0000 g | Freq: Once | INTRAVENOUS | Status: AC
  Administered 2011-11-24: 1 g via INTRAVENOUS
  Filled 2011-11-24: qty 10

## 2011-11-24 NOTE — Telephone Encounter (Signed)
Per Dr Allene Pyo order I have called pt and had to lmom for pt to keep 3-14 ov and to call with update on condition.

## 2011-11-24 NOTE — Discharge Instructions (Signed)
Pneumonia, Adult Pneumonia is an infection of the lungs.  CAUSES Pneumonia may be caused by bacteria or a virus. Usually, these infections are caused by breathing infectious particles into the lungs (respiratory tract). SYMPTOMS   Cough.   Fever.   Chest pain.   Increased rate of breathing.   Wheezing.   Mucus production.  DIAGNOSIS  If you have the common symptoms of pneumonia, your caregiver will typically confirm the diagnosis with a chest X-ray. The X-ray will show an abnormality in the lung (pulmonary infiltrate) if you have pneumonia. Other tests of your blood, urine, or sputum may be done to find the specific cause of your pneumonia. Your caregiver may also do tests (blood gases or pulse oximetry) to see how well your lungs are working. TREATMENT  Some forms of pneumonia may be spread to other people when you cough or sneeze. You may be asked to wear a mask before and during your exam. Pneumonia that is caused by bacteria is treated with antibiotic medicine. Pneumonia that is caused by the influenza virus may be treated with an antiviral medicine. Most other viral infections must run their course. These infections will not respond to antibiotics.  PREVENTION A pneumococcal shot (vaccine) is available to prevent a common bacterial cause of pneumonia. This is usually suggested for:  People over 65 years old.   Patients on chemotherapy.   People with chronic lung problems, such as bronchitis or emphysema.   People with immune system problems.  If you are over 65 or have a high risk condition, you may receive the pneumococcal vaccine if you have not received it before. In some countries, a routine influenza vaccine is also recommended. This vaccine can help prevent some cases of pneumonia.You may be offered the influenza vaccine as part of your care. If you smoke, it is time to quit. You may receive instructions on how to stop smoking. Your caregiver can provide medicines and  counseling to help you quit. HOME CARE INSTRUCTIONS   Cough suppressants may be used if you are losing too much rest. However, coughing protects you by clearing your lungs. You should avoid using cough suppressants if you can.   Your caregiver may have prescribed medicine if he or she thinks your pneumonia is caused by a bacteria or influenza. Finish your medicine even if you start to feel better.   Your caregiver may also prescribe an expectorant. This loosens the mucus to be coughed up.   Only take over-the-counter or prescription medicines for pain, discomfort, or fever as directed by your caregiver.   Do not smoke. Smoking is a common cause of bronchitis and can contribute to pneumonia. If you are a smoker and continue to smoke, your cough may last several weeks after your pneumonia has cleared.   A cold steam vaporizer or humidifier in your room or home may help loosen mucus.   Coughing is often worse at night. Sleeping in a semi-upright position in a recliner or using a couple pillows under your head will help with this.   Get rest as you feel it is needed. Your body will usually let you know when you need to rest.  SEEK IMMEDIATE MEDICAL CARE IF:   Your illness becomes worse. This is especially true if you are elderly or weakened from any other disease.   You cannot control your cough with suppressants and are losing sleep.   You begin coughing up blood.   You develop pain which is getting worse or   is uncontrolled with medicines.   You have a fever.   Any of the symptoms which initially brought you in for treatment are getting worse rather than better.   You develop shortness of breath or chest pain.  MAKE SURE YOU:   Understand these instructions.   Will watch your condition.   Will get help right away if you are not doing well or get worse.  Document Released: 09/04/2005 Document Revised: 08/24/2011 Document Reviewed: 11/24/2010 [facility] Patient Information 2012  [place], [place]. Dr. Derrell Lolling and arrange for an appointment next week to followup on your CT scan findings, which revealed a small questionable intussusception, which Dr. Mikey Bussing could not feel significant at this time.  If at any time he developed nausea, vomiting, constipation, diarrhea, excruciating abdominal pain.  Please return to the emergency room for further evaluation

## 2011-11-24 NOTE — ED Provider Notes (Signed)
Medical screening examination/treatment/procedure(s) were conducted as a shared visit with non-physician practitioner(s) and myself.  I personally evaluated the patient during the encounter  S/p colectomy 2/15 with myagias, subjective fever and mild abdominal pain.  Appears well, abdomen soft.  Glynn Octave, MD 11/24/11 1001

## 2011-11-30 ENCOUNTER — Ambulatory Visit (INDEPENDENT_AMBULATORY_CARE_PROVIDER_SITE_OTHER): Payer: 59 | Admitting: General Surgery

## 2011-11-30 ENCOUNTER — Encounter (INDEPENDENT_AMBULATORY_CARE_PROVIDER_SITE_OTHER): Payer: Self-pay | Admitting: General Surgery

## 2011-11-30 VITALS — BP 112/78 | HR 64 | Temp 98.4°F | Resp 12 | Ht 72.0 in | Wt 229.6 lb

## 2011-11-30 DIAGNOSIS — Z9889 Other specified postprocedural states: Secondary | ICD-10-CM

## 2011-11-30 NOTE — Patient Instructions (Signed)
You have recovered from your sigmoid colon resection without any apparent complications. You may resume all normal physical activities without restriction.  I advise a high fiber, low fat diet with emphasis on hydration.  You should begin periodic colonoscopies at age 36 for colorectal cancer screening.  Return to Dr. Derrell Lolling if any new problems arise.

## 2011-11-30 NOTE — Progress Notes (Signed)
Subjective:     Patient ID: Mark Silva, male   DOB: 02-Dec-1975, 36 y.o.   MRN: [id number]  HPI This patient underwent elective laparoscopic sigmoid colectomy on November 02, 2010. Final pathology showed diverticulitis. He recovered from the abdominal surgery without any intra-abdominal complications.  He was recently evaluated and treated for an upper respiratory infection but that has now resolved. He feels well minimal to resume all normal physical activities.  Review of Systems     Objective:   Physical Exam Patient looks well. In good spirits. Weight 229 pounds. BMI 31.14. Heart rate 64. Blood pressure 112/78. Temp. 98.4.  Abdomen is soft flat nontender. Laparoscopic wounds are all well healed no hernia or infection.    Assessment:     Recurrent acute diverticulitis, recovering uneventfully following laparoscopic sigmoid sigmoid colectomy.    Plan:     Okay to resume all normal physical activities without restriction starting next week.  High fiber, low fat diet with emphasis on hydration stressed.  Recommend begin colorectal cancer screening with colonoscopy at age 35.  Return to see me p.r.n.   Angelia Mould. Derrell Lolling, M.D., [facility] General and Minimally invasive Surgery Breast and Colorectal Surgery Office:   [phone] Pager:   [phone]

## 2012-04-05 ENCOUNTER — Encounter (INDEPENDENT_AMBULATORY_CARE_PROVIDER_SITE_OTHER): Payer: Self-pay

## 2012-05-19 ENCOUNTER — Encounter (HOSPITAL_COMMUNITY): Payer: Self-pay | Admitting: Emergency Medicine

## 2012-05-19 ENCOUNTER — Telehealth: Payer: Self-pay | Admitting: Internal Medicine

## 2012-05-19 ENCOUNTER — Emergency Department (HOSPITAL_COMMUNITY)
Admission: EM | Admit: 2012-05-19 | Discharge: 2012-05-20 | Disposition: A | Discharge status: Home / Self Care | Payer: BC Managed Care – PPO | Attending: Emergency Medicine | Admitting: Emergency Medicine

## 2012-05-19 DIAGNOSIS — K5792 Diverticulitis of intestine, part unspecified, without perforation or abscess without bleeding: Secondary | ICD-10-CM

## 2012-05-19 DIAGNOSIS — K5732 Diverticulitis of large intestine without perforation or abscess without bleeding: Secondary | ICD-10-CM | POA: Insufficient documentation

## 2012-05-19 NOTE — Telephone Encounter (Signed)
Hx of diverticulitis and resection 11/2011  Having LLQ pain and some incomplete defecation problems, mildly constipated  No fever  abd not tender but feels pain with activity  Has taken 2 doses MiraLax today, some defecation but not much help  No GU sxs  Advised more MiraLax to promote defecation, if no relief or worse ED is only option right now as needs in person assessment

## 2012-05-19 NOTE — ED Notes (Signed)
Pt alert, arrives from home, c/o abd pain, hx of diverticulitis, seen and treated, sx of colon, states s/s returned on Friday, contacted GI MD, instructed to come to ED

## 2012-05-20 ENCOUNTER — Emergency Department (HOSPITAL_COMMUNITY): Payer: BC Managed Care – PPO

## 2012-05-20 LAB — CBC WITH DIFFERENTIAL/PLATELET
Basophils Absolute: 0 10*3/uL (ref 0.0–0.1)
Lymphocytes Relative: 39 % (ref 12–46)
Lymphs Abs: 2.7 10*3/uL (ref 0.7–4.0)
Neutro Abs: 3.5 10*3/uL (ref 1.7–7.7)
Neutrophils Relative %: 50 % (ref 43–77)
Platelets: 164 10*3/uL (ref 150–400)
RBC: 4.94 MIL/uL (ref 4.22–5.81)
RDW: 12.4 % (ref 11.5–15.5)
WBC: 6.9 10*3/uL (ref 4.0–10.5)

## 2012-05-20 LAB — COMPREHENSIVE METABOLIC PANEL
Alkaline Phosphatase: 73 U/L (ref 39–117)
BUN: 14 mg/dL (ref 6–23)
Chloride: 101 mEq/L (ref 96–112)
Creatinine, Ser: 0.86 mg/dL (ref 0.50–1.35)
GFR calc Af Amer: >90 mL/min (ref >90)
Glucose, Bld: 108 mg/dL — ABNORMAL HIGH (ref 70–99)
Potassium: 4.2 mEq/L (ref 3.5–5.1)
Total Bilirubin: 0.3 mg/dL (ref 0.3–1.2)
Total Protein: 6.9 g/dL (ref 6.0–8.3)

## 2012-05-20 LAB — URINALYSIS, ROUTINE W REFLEX MICROSCOPIC
Ketones, ur: NEGATIVE mg/dL
Leukocytes, UA: NEGATIVE
Nitrite: NEGATIVE
Specific Gravity, Urine: 1.025 (ref 1.005–1.030)
pH: 5.5 (ref 5.0–8.0)

## 2012-05-20 MED ORDER — METRONIDAZOLE 500 MG PO TABS: ORAL_TABLET | ORAL | Status: DC

## 2012-05-20 MED ORDER — MORPHINE SULFATE 4 MG/ML IJ SOLN: 4.0000 mg | Freq: Once | INTRAMUSCULAR | Status: DC

## 2012-05-20 MED ORDER — OXYCODONE-ACETAMINOPHEN 5-325 MG PO TABS: 1.0000 | ORAL_TABLET | Freq: Four times a day (QID) | ORAL | Status: AC | PRN

## 2012-05-20 MED ORDER — IOHEXOL 300 MG/ML  SOLN
100.0000 mL | Freq: Once | INTRAMUSCULAR | Status: AC | PRN
  Administered 2012-05-20: 100 mL via INTRAVENOUS

## 2012-05-20 MED ORDER — MORPHINE SULFATE 4 MG/ML IJ SOLN
4.0000 mg | Freq: Once | INTRAMUSCULAR | Status: AC
  Administered 2012-05-20: 4 mg via INTRAVENOUS
  Filled 2012-05-20: qty 1

## 2012-05-20 MED ORDER — SULFAMETHOXAZOLE-TRIMETHOPRIM 800-160 MG PO TABS: 1.0000 | ORAL_TABLET | Freq: Two times a day (BID) | ORAL | Status: AC

## 2012-05-20 NOTE — ED Provider Notes (Addendum)
History     CSN: [id number]  Arrival date & time 05/19/12  2253   First MD Initiated Contact with Patient 05/20/12 [phone]      Chief Complaint  Patient presents with  . Abdominal Pain    (Consider location/radiation/quality/duration/timing/severity/associated sxs/prior treatment) Patient is a 36 y.o. male presenting with abdominal pain. The history is provided by the patient.  Abdominal Pain The primary symptoms of the illness include abdominal pain and diarrhea. The primary symptoms of the illness do not include fever, nausea or vomiting.  Symptoms associated with the illness do not include chills, diaphoresis or hematuria.   35 year old, male, with history of diverticulitis, and partial bowel resection , presents to emergency department complaining of lower abdominal pain.  For the past 2 days.  He denies nausea, vomiting, fevers, chills, urinary tract symptoms.  He has had a loose stools, but denies blood in stool.  Past Medical History  Diagnosis Date  . Diverticulitis   . Pneumonia     Past Surgical History  Procedure Date  . Back surgery   . Colon surgery     Family History  Problem Relation Age of Onset  . Heart disease Mother   . Hypertension Mother   . Heart disease Father   . Hypertension Father   . Cancer Father     lung  . Hypertension Sister   . Hypertension Brother     History  Substance Use Topics  . Smoking status: Never Smoker   . Smokeless tobacco: Not on file  . Alcohol Use: Yes     occ      Review of Systems  Constitutional: Negative for fever, chills and diaphoresis.  Gastrointestinal: Positive for abdominal pain and diarrhea. Negative for nausea, vomiting and blood in stool.  Genitourinary: Negative for hematuria.  Neurological: Negative for headaches.  Psychiatric/Behavioral: Negative for confusion.  All other systems reviewed and are negative.    Allergies  Food  Home Medications   Current Outpatient Rx  Name Route Sig  Dispense Refill  . IBUPROFEN 200 MG PO TABS Oral Take 600 mg by mouth every 6 (six) hours as needed. For pain    . OVER THE COUNTER MEDICATION Oral Take 1 capsule by mouth 2 (two) times daily. Supplement OXY ELITE weight loss    . POLYETHYLENE GLYCOL 3350 PO PACK Oral Take 17 g by mouth daily as needed. For constipation      BP 135/79  Pulse 69  Temp 98 F (36.7 C) (Oral)  Resp 16  SpO2 99%  Physical Exam  Nursing note and vitals reviewed. Constitutional: He is oriented to person, place, and time. He appears well-developed and well-nourished. No distress.  HENT:  Head: Normocephalic and atraumatic.  Eyes: Conjunctivae are normal.  Neck: Normal range of motion. Neck supple.  Cardiovascular: Normal rate and intact distal pulses.   No murmur heard. Pulmonary/Chest: Effort normal and breath sounds normal. He has no rales.  Abdominal: Soft. Bowel sounds are normal. He exhibits no distension. There is tenderness. There is rebound. There is no guarding.       Mild to moderate suprapubic tenderness  Musculoskeletal: Normal range of motion.  Neurological: He is alert and oriented to person, place, and time.  Skin: Skin is warm and dry.  Psychiatric: He has a normal mood and affect. Thought content normal.    ED Course  Procedures (including critical care time) 36 year old, male, with known history of diverticulitis, and partial bowel resection, presents to emergency department with  two-day history of symptoms consistent with his prior diverticulitis.  He is mild to moderate tenderness.  He does not appear toxic.  We will establish IV give pain medications and perform a CAT scan of his abdomen  Labs Reviewed  COMPREHENSIVE METABOLIC PANEL - Abnormal; Notable for the following:    Glucose, Bld 108 (*)     AST 45 (*)     All other components within normal limits  CBC WITH DIFFERENTIAL  URINALYSIS, ROUTINE W REFLEX MICROSCOPIC   No results found.   No diagnosis found.    MDM    Abdominal pain Diverticulitis.  No perforations.  No abscesses.  No acute abdomen.        Cheri Guppy, MD 05/20/12 1191  Cheri Guppy, MD 05/20/12 [phone]

## 2012-05-20 NOTE — ED Notes (Signed)
MD at bedside. 

## 2012-05-20 NOTE — ED Notes (Signed)
Patient is alert and oriented x3.  He was given DC instructions and follow up visit instructions.  Patient gave verbal understanding.  He was DC ambulatory under his own power to home.  V/S stable.  He was not showing any signs of distress on DC 

## 2017-09-09 ENCOUNTER — Emergency Department (HOSPITAL_COMMUNITY)
Admission: EM | Admit: 2017-09-09 | Discharge: 2017-09-09 | Disposition: A | Discharge status: Home / Self Care | Payer: 59 | Attending: Emergency Medicine | Admitting: Emergency Medicine

## 2017-09-09 ENCOUNTER — Emergency Department (HOSPITAL_COMMUNITY): Payer: 59

## 2017-09-09 ENCOUNTER — Encounter (HOSPITAL_COMMUNITY): Payer: Self-pay | Admitting: Emergency Medicine

## 2017-09-09 ENCOUNTER — Telehealth: Payer: Self-pay | Admitting: Internal Medicine

## 2017-09-09 ENCOUNTER — Other Ambulatory Visit: Payer: Self-pay

## 2017-09-09 DIAGNOSIS — Z79899 Other long term (current) drug therapy: Secondary | ICD-10-CM | POA: Insufficient documentation

## 2017-09-09 DIAGNOSIS — R1030 Lower abdominal pain, unspecified: Secondary | ICD-10-CM | POA: Insufficient documentation

## 2017-09-09 DIAGNOSIS — K2980 Duodenitis without bleeding: Secondary | ICD-10-CM | POA: Diagnosis not present

## 2017-09-09 DIAGNOSIS — R109 Unspecified abdominal pain: Secondary | ICD-10-CM

## 2017-09-09 LAB — CBC WITH DIFFERENTIAL/PLATELET
BASOS PCT: 0 %
Basophils Absolute: 0 10*3/uL (ref 0.0–0.1)
EOS PCT: 2 %
Eosinophils Absolute: 0.2 10*3/uL (ref 0.0–0.7)
LYMPHS ABS: 2 10*3/uL (ref 0.7–4.0)
LYMPHS PCT: 25 %
MONO ABS: 0.5 10*3/uL (ref 0.1–1.0)
MONOS PCT: 6 %
NEUTROS PCT: 67 %
Neutro Abs: 5.2 10*3/uL (ref 1.7–7.7)

## 2017-09-09 LAB — CBC
HCT: 41.8 % (ref 39.0–52.0)
HEMOGLOBIN: 14.2 g/dL (ref 13.0–17.0)
MCH: 30.4 pg (ref 26.0–34.0)
MCHC: 34 g/dL (ref 30.0–36.0)
MCV: 89.5 fL (ref 78.0–100.0)
Platelets: 147 10*3/uL — ABNORMAL LOW (ref 150–400)
RBC: 4.67 MIL/uL (ref 4.22–5.81)
RDW: 13 % (ref 11.5–15.5)
WBC: 7.9 10*3/uL (ref 4.0–10.5)

## 2017-09-09 LAB — URINALYSIS, ROUTINE W REFLEX MICROSCOPIC
BILIRUBIN URINE: NEGATIVE
Glucose, UA: NEGATIVE mg/dL
Hgb urine dipstick: NEGATIVE
Ketones, ur: NEGATIVE mg/dL
Leukocytes, UA: NEGATIVE
NITRITE: NEGATIVE
PROTEIN: NEGATIVE mg/dL
SPECIFIC GRAVITY, URINE: 1.014 (ref 1.005–1.030)
pH: 5 (ref 5.0–8.0)

## 2017-09-09 LAB — COMPREHENSIVE METABOLIC PANEL
ALK PHOS: 50 U/L (ref 38–126)
ALT: 24 U/L (ref 17–63)
ANION GAP: 11 (ref 5–15)
AST: 19 U/L (ref 15–41)
Albumin: 3.6 g/dL (ref 3.5–5.0)
BILIRUBIN TOTAL: 0.7 mg/dL (ref 0.3–1.2)
BUN: 12 mg/dL (ref 6–20)
CALCIUM: 9.1 mg/dL (ref 8.9–10.3)
CO2: 25 mmol/L (ref 22–32)
Chloride: 97 mmol/L — ABNORMAL LOW (ref 101–111)
Creatinine, Ser: 0.96 mg/dL (ref 0.61–1.24)
GFR calc non Af Amer: >60 mL/min (ref >60)
Glucose, Bld: 146 mg/dL — ABNORMAL HIGH (ref 65–99)
Potassium: 3.7 mmol/L (ref 3.5–5.1)
Sodium: 133 mmol/L — ABNORMAL LOW (ref 135–145)
TOTAL PROTEIN: 7.3 g/dL (ref 6.5–8.1)

## 2017-09-09 LAB — LIPASE, BLOOD: Lipase: 25 U/L (ref 11–51)

## 2017-09-09 MED ORDER — IOPAMIDOL (ISOVUE-300) INJECTION 61%
100.0000 mL | Freq: Once | INTRAVENOUS | Status: AC | PRN
  Administered 2017-09-09: 100 mL via INTRAVENOUS

## 2017-09-09 MED ORDER — MORPHINE SULFATE (PF) 4 MG/ML IV SOLN
4.0000 mg | INTRAVENOUS | Status: AC | PRN
  Administered 2017-09-09 (×2): 4 mg via INTRAVENOUS
  Filled 2017-09-09 (×2): qty 1

## 2017-09-09 MED ORDER — FAMOTIDINE IN NACL 20-0.9 MG/50ML-% IV SOLN
20.0000 mg | Freq: Once | INTRAVENOUS | Status: AC
  Administered 2017-09-09: 20 mg via INTRAVENOUS
  Filled 2017-09-09: qty 50

## 2017-09-09 MED ORDER — PANTOPRAZOLE SODIUM 20 MG PO TBEC: 20.0000 mg | DELAYED_RELEASE_TABLET | Freq: Every day | ORAL | 0 refills | Status: DC

## 2017-09-09 MED ORDER — PANTOPRAZOLE SODIUM 40 MG IV SOLR
40.0000 mg | Freq: Once | INTRAVENOUS | Status: AC
  Administered 2017-09-09: 40 mg via INTRAVENOUS
  Filled 2017-09-09: qty 40

## 2017-09-09 MED ORDER — ONDANSETRON HCL 4 MG/2ML IJ SOLN
4.0000 mg | INTRAMUSCULAR | Status: DC | PRN
  Administered 2017-09-09: 4 mg via INTRAVENOUS
  Filled 2017-09-09: qty 2

## 2017-09-09 NOTE — ED Notes (Signed)
Patient transported to CT 

## 2017-09-09 NOTE — ED Provider Notes (Signed)
[facility] EMERGENCY DEPARTMENT Provider Note   CSN: [id number] Arrival date & time: 09/09/17  1044     History   Chief Complaint Chief Complaint  Patient presents with  . Abdominal Pain    HPI Mark Silva is a 41 y.o. male.  HPI  Pt was seen at 1130.  Per pt, c/o gradual onset and persistence of constant lower abd "pain" for the past 3 days.  Has been associated with no other symptoms.  Describes the abd pain as "like when I had diverticulitis."  Pt called his GI Dr. Leone Payor and was told to come to the ED for evaluation. Denies N/V, no diarrhea, no fevers, no back pain, no rash, no CP/SOB, no black or blood in stools, no dysuria/hematuria, no testicular pain/swelling.      Past Medical History:  Diagnosis Date  . Diverticulitis   . Pneumonia     Patient Active Problem List   Diagnosis Date Noted  . Diverticulitis of colon (without mention of hemorrhage)(562.11) 10/03/2011    Past Surgical History:  Procedure Laterality Date  . BACK SURGERY    . COLON SURGERY         Home Medications    Prior to Admission medications   Medication Sig Start Date End Date Taking? Authorizing Provider  ibuprofen (ADVIL,MOTRIN) 200 MG tablet Take 600 mg by mouth every 6 (six) hours as needed. For pain   Yes [provider]  gabapentin (NEURONTIN) 300 MG capsule Take 1 capsule by mouth 3 (three) times daily. 08/17/17   [provider]    Family History Family History  Problem Relation Age of Onset  . Heart disease Mother   . Hypertension Mother   . Heart disease Father   . Hypertension Father   . Cancer Father        lung  . Hypertension Sister   . Hypertension Brother     Social History Social History   Tobacco Use  . Smoking status: Never Smoker  . Smokeless tobacco: Never Used  Substance Use Topics  . Alcohol use: Yes    Comment: occ  . Drug use: No     Allergies   Food   Review of Systems Review of Systems ROS: Statement: All  systems negative except as marked or noted in the HPI; Constitutional: Negative for fever and chills. ; ; Eyes: Negative for eye pain, redness and discharge. ; ; ENMT: Negative for ear pain, hoarseness, nasal congestion, sinus pressure and sore throat. ; ; Cardiovascular: Negative for chest pain, palpitations, diaphoresis, dyspnea and peripheral edema. ; ; Respiratory: Negative for cough, wheezing and stridor. ; ; Gastrointestinal: +abd pain. Negative for nausea, vomiting, diarrhea, blood in stool, hematemesis, jaundice and rectal bleeding. . ; ; Genitourinary: Negative for dysuria, flank pain and hematuria. ; ; Genital:  No penile drainage or rash, no testicular pain or swelling, no scrotal rash or swelling. ;; Musculoskeletal: Negative for back pain and neck pain. Negative for swelling and trauma.; ; Skin: Negative for pruritus, rash, abrasions, blisters, bruising and skin lesion.; ; Neuro: Negative for headache, lightheadedness and neck stiffness. Negative for weakness, altered level of consciousness, altered mental status, extremity weakness, paresthesias, involuntary movement, seizure and syncope.       Physical Exam Updated Vital Signs BP (!) 135/92 (BP Location: Right Arm)   Pulse 82   Temp 98.6 F (37 C) (Oral)   Resp 18   Ht 6' (1.829 m)   Wt 120.7 kg (266 lb)  SpO2 97%   BMI 36.08 kg/m   Physical Exam 1135: Physical examination:  Nursing notes reviewed; Vital signs and O2 SAT reviewed;  Constitutional: Well developed, Well nourished, Well hydrated, In no acute distress; Head:  Normocephalic, atraumatic; Eyes: EOMI, PERRL, No scleral icterus; ENMT: Mouth and pharynx normal, Mucous membranes moist; Neck: Supple, Full range of motion, No lymphadenopathy; Cardiovascular: Regular rate and rhythm, No gallop; Respiratory: Breath sounds clear & equal bilaterally, No wheezes.  Speaking full sentences with ease, Normal respiratory effort/excursion; Chest: Nontender, Movement normal; Abdomen:  Soft, +RLQ and LLQ tender to palp. No rebound or guarding. Nondistended, Normal bowel sounds; Genitourinary: No CVA tenderness; Extremities: Pulses normal, No tenderness, No edema, No calf edema or asymmetry.; Neuro: AA&Ox3, Major CN grossly intact.  Speech clear. No gross focal motor or sensory deficits in extremities. Climbs on and off stretcher easily by himself. Gait steady..; Skin: Color normal, Warm, Dry.   ED Treatments / Results  Labs (all labs ordered are listed, but only abnormal results are displayed)   EKG  EKG Interpretation None       Radiology   Procedures Procedures (including critical care time)  Medications Ordered in ED Medications  morphine 4 MG/ML injection 4 mg (not administered)  ondansetron (ZOFRAN) injection 4 mg (not administered)     Initial Impression / Assessment and Plan / ED Course  I have reviewed the triage vital signs and the nursing notes.  Pertinent labs & imaging results that were available during my care of the patient were reviewed by me and considered in my medical decision making (see chart for details).  MDM Reviewed: previous chart, nursing note and vitals Reviewed previous: labs Interpretation: labs and CT scan   Results for orders placed or performed during the hospital encounter of 09/09/17  Lipase, blood  Result Value Ref Range   Lipase 25 11 - 51 U/L  Comprehensive metabolic panel  Result Value Ref Range   Sodium 133 (L) 135 - 145 mmol/L   Potassium 3.7 3.5 - 5.1 mmol/L   Chloride 97 (L) 101 - 111 mmol/L   CO2 25 22 - 32 mmol/L   Glucose, Bld 146 (H) 65 - 99 mg/dL   BUN 12 6 - 20 mg/dL   Creatinine, Ser 1.610.96 0.61 - 1.24 mg/dL   Calcium 9.1 8.9 - 09.610.3 mg/dL   Total Protein 7.3 6.5 - 8.1 g/dL   Albumin 3.6 3.5 - 5.0 g/dL   AST 19 15 - 41 U/L   ALT 24 17 - 63 U/L   Alkaline Phosphatase 50 38 - 126 U/L   Total Bilirubin 0.7 0.3 - 1.2 mg/dL   GFR calc non Af Amer >60 >60 mL/min   GFR calc Af Amer >60 >60 mL/min    Anion gap 11 5 - 15  CBC  Result Value Ref Range   WBC 7.9 4.0 - 10.5 K/uL   RBC 4.67 4.22 - 5.81 MIL/uL   Hemoglobin 14.2 13.0 - 17.0 g/dL   HCT 04.541.8 40.939.0 - 81.152.0 %   MCV 89.5 78.0 - 100.0 fL   MCH 30.4 26.0 - 34.0 pg   MCHC 34.0 30.0 - 36.0 g/dL   RDW 91.413.0 78.211.5 - 95.615.5 %   Platelets 147 (L) 150 - 400 K/uL  Urinalysis, Routine w reflex microscopic  Result Value Ref Range   Color, Urine YELLOW YELLOW   APPearance CLEAR CLEAR   Specific Gravity, Urine 1.014 1.005 - 1.030   pH 5.0 5.0 - 8.0  Glucose, UA NEGATIVE NEGATIVE mg/dL   Hgb urine dipstick NEGATIVE NEGATIVE   Bilirubin Urine NEGATIVE NEGATIVE   Ketones, ur NEGATIVE NEGATIVE mg/dL   Protein, ur NEGATIVE NEGATIVE mg/dL   Nitrite NEGATIVE NEGATIVE   Leukocytes, UA NEGATIVE NEGATIVE  CBC with Differential/Platelet  Result Value Ref Range   WBC DUP 4.0 - 10.5 K/uL   RBC DUP 4.22 - 5.81 MIL/uL   Hemoglobin DUP 13.0 - 17.0 g/dL   HCT DUP 16.139.0 - 09.652.0 %   MCV DUP 78.0 - 100.0 fL   MCH DUP 26.0 - 34.0 pg   MCHC DUP 30.0 - 36.0 g/dL   RDW DUP 04.511.5 - 40.915.5 %   Platelets DUP 150 - 400 K/uL   Neutrophils Relative % 67 %   Neutro Abs 5.2 1.7 - 7.7 K/uL   Lymphocytes Relative 25 %   Lymphs Abs 2.0 0.7 - 4.0 K/uL   Monocytes Relative 6 %   Monocytes Absolute 0.5 0.1 - 1.0 K/uL   Eosinophils Relative 2 %   Eosinophils Absolute 0.2 0.0 - 0.7 K/uL   Basophils Relative 0 %   Basophils Absolute 0.0 0.0 - 0.1 K/uL   Ct Abdomen Pelvis W Contrast Result Date: 09/09/2017 CLINICAL DATA:  Acute periumbilical abdominal pain. EXAM: CT ABDOMEN AND PELVIS WITH CONTRAST TECHNIQUE: Multidetector CT imaging of the abdomen and pelvis was performed using the standard protocol following bolus administration of intravenous contrast. CONTRAST:  100mL ISOVUE-300 IOPAMIDOL (ISOVUE-300) INJECTION 61% COMPARISON:  CT scan of May 20, 2012. FINDINGS: Lower chest: No acute abnormality. Hepatobiliary: No focal liver abnormality is seen. No gallstones,  gallbladder wall thickening, or biliary dilatation. Pancreas: Unremarkable. No pancreatic ductal dilatation or surrounding inflammatory changes. Spleen: Normal in size without focal abnormality. Adrenals/Urinary Tract: Adrenal glands appear normal. Stable left renal cyst is noted. Nonobstructive right renal calculus is noted. No hydronephrosis or renal obstruction is noted. Urinary bladder is unremarkable. Stomach/Bowel: The appendix appears normal. The stomach is unremarkable. Severe wall thickening and inflammatory changes are seen involving the second and third portion of the duodenum concerning for severe peptic ulcer disease. No colonic dilatation is noted. Vascular/Lymphatic: No significant vascular findings are present. No enlarged abdominal or pelvic lymph nodes. Reproductive: Prostate is unremarkable. Other: No abdominal wall hernia or abnormality. No abdominopelvic ascites. Musculoskeletal: No acute or significant osseous findings. IMPRESSION: Wall thickening inflammatory changes are seen involving the second and third portion of the duodenum concerning for peptic ulcer disease. Endoscopy is recommended for further evaluation. Nonobstructive right renal calculus. No hydronephrosis or renal obstruction is noted. Electronically Signed   By: Lupita RaiderJames  Green Jr, M.D.   On: 09/09/2017 12:41    1310:  Will dose IV pepcid and protonix.  T/C to GI Dr. Leone PayorGessner, case discussed, including:  HPI, pertinent PM/SHx, VS/PE, dx testing, ED course and treatment:  Agrees with ED treatment, rx PPI, f/u office. Dx and testing, as well as d/w GI MD, d/w pt and family.  Questions answered.  Verb understanding, agreeable to d/c home with outpt f/u.   Final Clinical Impressions(s) / ED Diagnoses   Final diagnoses:  None    ED Discharge Orders    None       Samuel JesterMcManus, Tyniah Kastens, DO 09/11/17 [phone]

## 2017-09-09 NOTE — Discharge Instructions (Signed)
Eat a bland diet, avoiding greasy, fatty, fried foods, as well as spicy and acidic foods or beverages.  Avoid eating within 2 to 3 hours before going to bed or laying down.  Also avoid teas, colas, coffee, chocolate, pepermint and spearment. Avoid aspirin and ibuprofen products. Take the prescription as directed.  Call your regular GI doctor on Monday to schedule a follow up appointment this week.  Return to the Emergency Department immediately if worsening.

## 2017-09-09 NOTE — ED Triage Notes (Signed)
PT c/o periumbilical abdomen pain with no n/v/d x3 days. PT states hx of colon resection for diverticulitis in 2012. PT denies any urinary symptoms.

## 2017-09-09 NOTE — Telephone Encounter (Signed)
Patient of Dr. Elnoria HowardHung  S/p colon resection for recurrent diverticulitis in past (2013)  After being well x years having similar sxs of LLQ pain and constipation, like past diverticulitis  I explained would be best to have an evaluation in person and recommended he go to ED and that CT scan eval is likely  He agreed to do so

## 2018-08-18 ENCOUNTER — Encounter (HOSPITAL_COMMUNITY): Payer: Self-pay | Admitting: *Deleted

## 2018-08-18 ENCOUNTER — Emergency Department (HOSPITAL_COMMUNITY): Payer: 59

## 2018-08-18 ENCOUNTER — Emergency Department (HOSPITAL_COMMUNITY)
Admission: EM | Admit: 2018-08-18 | Discharge: 2018-08-18 | Disposition: A | Discharge status: Home / Self Care | Payer: 59 | Attending: Emergency Medicine | Admitting: Emergency Medicine

## 2018-08-18 DIAGNOSIS — Z79899 Other long term (current) drug therapy: Secondary | ICD-10-CM | POA: Diagnosis not present

## 2018-08-18 DIAGNOSIS — R1031 Right lower quadrant pain: Secondary | ICD-10-CM | POA: Diagnosis present

## 2018-08-18 DIAGNOSIS — K859 Acute pancreatitis without necrosis or infection, unspecified: Secondary | ICD-10-CM | POA: Diagnosis not present

## 2018-08-18 HISTORY — DX: Helicobacter pylori (H. pylori) as the cause of diseases classified elsewhere: B96.81

## 2018-08-18 HISTORY — DX: Peptic ulcer, site unspecified, unspecified as acute or chronic, without hemorrhage or perforation: K27.9

## 2018-08-18 LAB — COMPREHENSIVE METABOLIC PANEL
ALT: 32 U/L (ref 0–44)
AST: 27 U/L (ref 15–41)
Albumin: 4 g/dL (ref 3.5–5.0)
Alkaline Phosphatase: 55 U/L (ref 38–126)
Anion gap: 8 (ref 5–15)
BUN: 13 mg/dL (ref 6–20)
CO2: 25 mmol/L (ref 22–32)
Calcium: 8.7 mg/dL — ABNORMAL LOW (ref 8.9–10.3)
Chloride: 97 mmol/L — ABNORMAL LOW (ref 98–111)
Creatinine, Ser: 0.88 mg/dL (ref 0.61–1.24)
GFR calc Af Amer: >60 mL/min (ref >60)
GFR calc non Af Amer: >60 mL/min (ref >60)
Glucose, Bld: 194 mg/dL — ABNORMAL HIGH (ref 70–99)
Potassium: 3.5 mmol/L (ref 3.5–5.1)
Sodium: 130 mmol/L — ABNORMAL LOW (ref 135–145)
Total Bilirubin: 0.6 mg/dL (ref 0.3–1.2)
Total Protein: 6.7 g/dL (ref 6.5–8.1)

## 2018-08-18 LAB — CBC WITH DIFFERENTIAL/PLATELET
Abs Immature Granulocytes: 0.03 10*3/uL (ref 0.00–0.07)
Basophils Absolute: 0 10*3/uL (ref 0.0–0.1)
Basophils Relative: 0 %
Eosinophils Absolute: 0.1 10*3/uL (ref 0.0–0.5)
Eosinophils Relative: 1 %
HCT: 44 % (ref 39.0–52.0)
HEMOGLOBIN: 16 g/dL (ref 13.0–17.0)
Immature Granulocytes: 0 %
Lymphocytes Relative: 26 %
Lymphs Abs: 2.4 10*3/uL (ref 0.7–4.0)
MCH: 31.1 pg (ref 26.0–34.0)
MCHC: 36.4 g/dL — AB (ref 30.0–36.0)
MCV: 85.6 fL (ref 80.0–100.0)
Monocytes Absolute: 0.5 10*3/uL (ref 0.1–1.0)
Monocytes Relative: 5 %
Neutro Abs: 6.3 10*3/uL (ref 1.7–7.7)
Neutrophils Relative %: 68 %
Platelets: 195 10*3/uL (ref 150–400)
RBC: 5.14 MIL/uL (ref 4.22–5.81)
RDW: 13 % (ref 11.5–15.5)
WBC: 9.4 10*3/uL (ref 4.0–10.5)
nRBC: 0 % (ref 0.0–0.2)

## 2018-08-18 LAB — URINALYSIS, ROUTINE W REFLEX MICROSCOPIC
Bilirubin Urine: NEGATIVE
Glucose, UA: 50 mg/dL — AB
Hgb urine dipstick: NEGATIVE
KETONES UR: NEGATIVE mg/dL
LEUKOCYTES UA: NEGATIVE
Nitrite: NEGATIVE
Protein, ur: NEGATIVE mg/dL
Specific Gravity, Urine: 1.011 (ref 1.005–1.030)
pH: 5 (ref 5.0–8.0)

## 2018-08-18 LAB — LIPASE, BLOOD: Lipase: 57 U/L — ABNORMAL HIGH (ref 11–51)

## 2018-08-18 MED ORDER — FAMOTIDINE 20 MG PO TABS: 20.0000 mg | ORAL_TABLET | Freq: Two times a day (BID) | ORAL | 0 refills | Status: DC

## 2018-08-18 MED ORDER — HYDROMORPHONE HCL 1 MG/ML IJ SOLN
1.0000 mg | Freq: Once | INTRAMUSCULAR | Status: AC
  Administered 2018-08-18: 1 mg via INTRAVENOUS
  Filled 2018-08-18: qty 1

## 2018-08-18 MED ORDER — HYDROCODONE-ACETAMINOPHEN 5-325 MG PO TABS: 1.0000 | ORAL_TABLET | ORAL | 0 refills | Status: DC | PRN

## 2018-08-18 MED ORDER — SODIUM CHLORIDE 0.9 % IV BOLUS
1000.0000 mL | Freq: Once | INTRAVENOUS | Status: AC
  Administered 2018-08-18: 1000 mL via INTRAVENOUS

## 2018-08-18 MED ORDER — IOPAMIDOL (ISOVUE-300) INJECTION 61%
100.0000 mL | Freq: Once | INTRAVENOUS | Status: AC | PRN
  Administered 2018-08-18: 100 mL via INTRAVENOUS

## 2018-08-18 MED ORDER — PANTOPRAZOLE SODIUM 40 MG PO TBEC: 40.0000 mg | DELAYED_RELEASE_TABLET | Freq: Two times a day (BID) | ORAL | 0 refills | Status: DC

## 2018-08-18 MED ORDER — FAMOTIDINE IN NACL 20-0.9 MG/50ML-% IV SOLN
20.0000 mg | Freq: Once | INTRAVENOUS | Status: AC
  Administered 2018-08-18: 20 mg via INTRAVENOUS
  Filled 2018-08-18: qty 50

## 2018-08-18 MED ORDER — HYDROCODONE-ACETAMINOPHEN 5-325 MG PO TABS
1.0000 | ORAL_TABLET | Freq: Once | ORAL | Status: AC
  Administered 2018-08-18: 1 via ORAL
  Filled 2018-08-18: qty 1

## 2018-08-18 MED ORDER — PANTOPRAZOLE SODIUM 40 MG IV SOLR
40.0000 mg | Freq: Once | INTRAVENOUS | Status: AC
  Administered 2018-08-18: 40 mg via INTRAVENOUS
  Filled 2018-08-18: qty 40

## 2018-08-18 NOTE — Discharge Instructions (Addendum)
Return to the ER if you develop fever, vomiting or vomiting blood, severe worsening abdominal pain, blood in your stool or black stools, or any other new/concerning symptoms.  Avoid NSAIDs such as ibuprofen, Advil, Aleve, naproxen, etc.  Do not take aspirin's.  Do not drink alcohol.  It is very important to follow-up with your gastroenterologist for further investigation of your abdominal pain.  Use a clear liquid diet for the next 24-48 hours.

## 2018-08-18 NOTE — ED Triage Notes (Signed)
Pt with abd pain since last night, hx of diverticulitis.  Pt denies N/V/D.

## 2018-08-18 NOTE — ED Provider Notes (Signed)
[facility] EMERGENCY DEPARTMENT Provider Note   CSN: [id number] Arrival date & time: 08/18/18  2104     History   Chief Complaint Chief Complaint  Patient presents with  . Abdominal Pain    HPI Mark Silva is a 42 y.o. male.  HPI  42 year old male with a history of diverticulitis and prior partial colectomy as a result, presents with right lower quadrant abdominal pain.  Started last night.  He is been having a lot of gas and distention.  At first his pain seem to be in the upper abdomen and now is more focal in the right lower quadrant.  There is no fever, vomiting, diarrhea, back pain or urinary symptoms.  Pain is pretty constant but does wax and wane.  Nothing specifically makes it worse.  Is about a 6 out of 10 at this time.  Past Medical History:  Diagnosis Date  . Diverticulitis   . H pylori ulcer   . Pneumonia     Patient Active Problem List   Diagnosis Date Noted  . Diverticulitis of colon (without mention of hemorrhage)(562.11) 10/03/2011    Past Surgical History:  Procedure Laterality Date  . BACK SURGERY    . COLON SURGERY          Home Medications    Prior to Admission medications   Medication Sig Start Date End Date Taking? Authorizing Provider  diphenhydrAMINE HCl, Sleep, (ZZZQUIL PO) Take 1 tablet by mouth at bedtime.   Yes [provider]  ibuprofen (ADVIL,MOTRIN) 200 MG tablet Take 600 mg by mouth every 6 (six) hours as needed. For pain   Yes [provider]  famotidine (PEPCID) 20 MG tablet Take 1 tablet (20 mg total) by mouth 2 (two) times daily. 08/18/18   Pricilla Loveless, MD  HYDROcodone-acetaminophen (NORCO) 5-325 MG tablet Take 1 tablet by mouth every 4 (four) hours as needed for severe pain. 08/18/18   Pricilla Loveless, MD  pantoprazole (PROTONIX) 40 MG tablet Take 1 tablet (40 mg total) by mouth 2 (two) times daily before a meal. 08/18/18   Pricilla Loveless, MD    Family History Family History  Problem Relation Age of  Onset  . Heart disease Mother   . Hypertension Mother   . Heart disease Father   . Hypertension Father   . Cancer Father        lung  . Hypertension Sister   . Hypertension Brother     Social History Social History   Tobacco Use  . Smoking status: Never Smoker  . Smokeless tobacco: Never Used  Substance Use Topics  . Alcohol use: Yes    Comment: occ  . Drug use: No     Allergies   Food   Review of Systems Review of Systems  Constitutional: Negative for fever.  Gastrointestinal: Positive for abdominal pain. Negative for diarrhea, nausea and vomiting.  Genitourinary: Negative for dysuria.  Musculoskeletal: Negative for back pain.  All other systems reviewed and are negative.    Physical Exam Updated Vital Signs BP (!) 161/84 (BP Location: Right Arm)   Pulse 97   Temp 98.1 F (36.7 C) (Oral)   Resp 17   Ht 6' (1.829 m)   Wt 124.7 kg   SpO2 98%   BMI 37.30 kg/m   Physical Exam  Constitutional: He appears well-developed and well-nourished.  Non-toxic appearance. He does not appear ill.  obese  HENT:  Head: Normocephalic and atraumatic.  Right Ear: External ear normal.  Left Ear:  External ear normal.  Nose: Nose normal.  Eyes: Right eye exhibits no discharge. Left eye exhibits no discharge.  Neck: Neck supple.  Cardiovascular: Normal rate, regular rhythm and normal heart sounds.  Pulmonary/Chest: Effort normal and breath sounds normal.  Abdominal: Soft. There is generalized tenderness (generalized tenderness that is mild; more tender in RLQ) and tenderness in the right lower quadrant.  Lower abdominal surgical scar  Musculoskeletal: He exhibits no edema.  Neurological: He is alert.  Skin: Skin is warm and dry.  Psychiatric: His mood appears not anxious.  Nursing note and vitals reviewed.    ED Treatments / Results  Labs (all labs ordered are listed, but only abnormal results are displayed) Labs Reviewed  URINALYSIS, ROUTINE W REFLEX MICROSCOPIC -  Abnormal; Notable for the following components:      Result Value   Glucose, UA 50 (*)    All other components within normal limits  COMPREHENSIVE METABOLIC PANEL - Abnormal; Notable for the following components:   Sodium 130 (*)    Chloride 97 (*)    Glucose, Bld 194 (*)    Calcium 8.7 (*)    All other components within normal limits  LIPASE, BLOOD - Abnormal; Notable for the following components:   Lipase 57 (*)    All other components within normal limits  CBC WITH DIFFERENTIAL/PLATELET - Abnormal; Notable for the following components:   MCHC 36.4 (*)    All other components within normal limits    EKG None  Radiology Ct Abdomen Pelvis W Contrast  Result Date: 08/18/2018 CLINICAL DATA:  Abdominal pain. EXAM: CT ABDOMEN AND PELVIS WITH CONTRAST TECHNIQUE: Multidetector CT imaging of the abdomen and pelvis was performed using the standard protocol following bolus administration of intravenous contrast. CONTRAST:  100mL ISOVUE-300 IOPAMIDOL (ISOVUE-300) INJECTION 61% COMPARISON:  CT scan September 09, 2017 FINDINGS: Lower chest: No acute abnormality. Hepatobiliary: There are a few probable tiny cysts in the liver with no suspicious masses. Hepatic steatosis noted. Portal vein is patent. The gallbladder is unremarkable. Pancreas: There is marked fat stranding adjacent to the second and third portions of the duodenum and the pancreatic head/uncinate process. Mild edema in these portions of the pancreas. The remainder of the pancreas is normal. Spleen: Normal in size without focal abnormality. Adrenals/Urinary Tract: Adrenal glands are normal. There is a cyst in the left kidney. There is a nonobstructive stone in the lower pole the left kidney. The kidneys are otherwise normal. The ureters and bladder are unremarkable. Stomach/Bowel: The stomach is normal. There is wall thickening associated with the distal second portion of the duodenum in addition to the third portion of the duodenum. There is  adjacent fat stranding. The remainder of the small bowel is normal. The colon and appendix are normal. Vascular/Lymphatic: No significant vascular findings are present. No enlarged abdominal or pelvic lymph nodes. Reproductive: Prostate is unremarkable. Other: No abdominal wall hernia or abnormality. No abdominopelvic ascites. Musculoskeletal: No acute or significant osseous findings. IMPRESSION: 1. The fat stranding adjacent to the pancreas and duodenum may be due to pancreatitis with secondary involvement of the duodenum. The patient does have an elevated lipase. Duodenitis with secondary involvement of the pancreas could have a similar appearance but is less common. Recommend clinical correlation and attention on follow-up. The findings on today's study are similar in appearance compared to the September 09, 2017 study. 2. Hepatic steatosis. 3. Nonobstructive stone in the lower left kidney. Electronically Signed   By: Gerome Samavid  Williams III M.D  On: 08/18/2018 22:37    Procedures Procedures (including critical care time)  Medications Ordered in ED Medications  famotidine (PEPCID) IVPB 20 mg premix (20 mg Intravenous New Bag/Given 08/18/18 2256)  sodium chloride 0.9 % bolus 1,000 mL (0 mLs Intravenous Stopped 08/18/18 2253)  HYDROmorphone (DILAUDID) injection 1 mg (1 mg Intravenous Given 08/18/18 2146)  iopamidol (ISOVUE-300) 61 % injection 100 mL (100 mLs Intravenous Contrast Given 08/18/18 2156)  pantoprazole (PROTONIX) injection 40 mg (40 mg Intravenous Given 08/18/18 2253)     Initial Impression / Assessment and Plan / ED Course  I have reviewed the triage vital signs and the nursing notes.  Pertinent labs & imaging results that were available during my care of the patient were reviewed by me and considered in my medical decision making (see chart for details).     Patient's presentation is similar to December 2018.  At that time he was diagnosed with duodenitis.  CT this time shows similar  findings but could be pancreatitis as well.  This is probably more likely given the findings on CT and the mildly elevated lipase.  I think is reasonable to cover for both with PPI and H2 blocker in addition to clear liquid diet for 24-48 hours.  Overall his vitals are reassuring and he does not appear ill and is feeling much better after treatment in the ED.  There is no obvious appendicitis or other acute abdominal emergency.  He drinks alcohol but rarely.  He uses NSAIDs and aspirin but also only occasionally.  He was advised to not use these for the time being and we stressed the importance of following up with a gastroenterologist for further work-up/follow-up.  He appears stable for discharge home.  We discussed strict return precautions.  Final Clinical Impressions(s) / ED Diagnoses   Final diagnoses:  Acute pancreatitis without infection or necrosis, unspecified pancreatitis type    ED Discharge Orders         Ordered    pantoprazole (PROTONIX) 40 MG tablet  2 times daily before meals     08/18/18 2318    famotidine (PEPCID) 20 MG tablet  2 times daily     08/18/18 2318    HYDROcodone-acetaminophen (NORCO) 5-325 MG tablet  Every 4 hours PRN     08/18/18 2318           Pricilla Loveless, MD 08/18/18 2318

## 2018-09-16 ENCOUNTER — Other Ambulatory Visit: Payer: Self-pay | Admitting: Gastroenterology

## 2018-10-18 ENCOUNTER — Encounter (HOSPITAL_COMMUNITY): Admission: RE | Payer: Self-pay | Source: Home / Self Care

## 2018-10-18 ENCOUNTER — Ambulatory Visit (HOSPITAL_COMMUNITY): Admission: RE | Admit: 2018-10-18 | Payer: 59 | Source: Home / Self Care | Admitting: Gastroenterology

## 2018-10-18 SURGERY — UPPER ESOPHAGEAL ENDOSCOPIC ULTRASOUND (EUS)
Anesthesia: Monitor Anesthesia Care

## 2019-01-21 ENCOUNTER — Inpatient Hospital Stay (HOSPITAL_COMMUNITY)
Admission: EM | Admit: 2019-01-21 | Discharge: 2019-01-25 | DRG: 439 | Disposition: A | Discharge status: Home / Self Care | Payer: 59 | Attending: Internal Medicine | Admitting: Internal Medicine

## 2019-01-21 ENCOUNTER — Emergency Department (HOSPITAL_COMMUNITY): Payer: 59

## 2019-01-21 ENCOUNTER — Encounter (HOSPITAL_COMMUNITY): Payer: Self-pay | Admitting: Emergency Medicine

## 2019-01-21 ENCOUNTER — Other Ambulatory Visit: Payer: Self-pay

## 2019-01-21 DIAGNOSIS — E160 Drug-induced hypoglycemia without coma: Secondary | ICD-10-CM | POA: Diagnosis not present

## 2019-01-21 DIAGNOSIS — K2980 Duodenitis without bleeding: Secondary | ICD-10-CM | POA: Diagnosis present

## 2019-01-21 DIAGNOSIS — Z8701 Personal history of pneumonia (recurrent): Secondary | ICD-10-CM

## 2019-01-21 DIAGNOSIS — K859 Acute pancreatitis without necrosis or infection, unspecified: Secondary | ICD-10-CM | POA: Diagnosis not present

## 2019-01-21 DIAGNOSIS — R6510 Systemic inflammatory response syndrome (SIRS) of non-infectious origin without acute organ dysfunction: Secondary | ICD-10-CM | POA: Diagnosis present

## 2019-01-21 DIAGNOSIS — Z9049 Acquired absence of other specified parts of digestive tract: Secondary | ICD-10-CM

## 2019-01-21 DIAGNOSIS — E861 Hypovolemia: Secondary | ICD-10-CM | POA: Diagnosis present

## 2019-01-21 DIAGNOSIS — Z91018 Allergy to other foods: Secondary | ICD-10-CM

## 2019-01-21 DIAGNOSIS — Z20828 Contact with and (suspected) exposure to other viral communicable diseases: Secondary | ICD-10-CM | POA: Diagnosis present

## 2019-01-21 DIAGNOSIS — Z8619 Personal history of other infectious and parasitic diseases: Secondary | ICD-10-CM

## 2019-01-21 DIAGNOSIS — E871 Hypo-osmolality and hyponatremia: Secondary | ICD-10-CM | POA: Diagnosis present

## 2019-01-21 DIAGNOSIS — E781 Pure hyperglyceridemia: Secondary | ICD-10-CM | POA: Diagnosis present

## 2019-01-21 DIAGNOSIS — E876 Hypokalemia: Secondary | ICD-10-CM | POA: Diagnosis not present

## 2019-01-21 DIAGNOSIS — Y92230 Patient room in hospital as the place of occurrence of the external cause: Secondary | ICD-10-CM | POA: Diagnosis not present

## 2019-01-21 DIAGNOSIS — Z801 Family history of malignant neoplasm of trachea, bronchus and lung: Secondary | ICD-10-CM

## 2019-01-21 DIAGNOSIS — T383X5A Adverse effect of insulin and oral hypoglycemic [antidiabetic] drugs, initial encounter: Secondary | ICD-10-CM | POA: Diagnosis not present

## 2019-01-21 DIAGNOSIS — Z79899 Other long term (current) drug therapy: Secondary | ICD-10-CM

## 2019-01-21 LAB — COMPREHENSIVE METABOLIC PANEL
ALT: 21 U/L (ref 0–44)
AST: 19 U/L (ref 15–41)
Albumin: 3.9 g/dL (ref 3.5–5.0)
Alkaline Phosphatase: 55 U/L (ref 38–126)
Anion gap: 10 (ref 5–15)
BUN: 9 mg/dL (ref 6–20)
CO2: 24 mmol/L (ref 22–32)
Calcium: 9.1 mg/dL (ref 8.9–10.3)
Chloride: 94 mmol/L — ABNORMAL LOW (ref 98–111)
Creatinine, Ser: 0.84 mg/dL (ref 0.61–1.24)
GFR calc Af Amer: >60 mL/min (ref >60)
GFR calc non Af Amer: >60 mL/min (ref >60)
Glucose, Bld: 168 mg/dL — ABNORMAL HIGH (ref 70–99)
Potassium: 3.5 mmol/L (ref 3.5–5.1)
Sodium: 128 mmol/L — ABNORMAL LOW (ref 135–145)
Total Bilirubin: 1.2 mg/dL (ref 0.3–1.2)
Total Protein: 6.9 g/dL (ref 6.5–8.1)

## 2019-01-21 LAB — URINALYSIS, ROUTINE W REFLEX MICROSCOPIC
Bacteria, UA: NONE SEEN
Bilirubin Urine: NEGATIVE
Glucose, UA: 50 mg/dL — AB
Hgb urine dipstick: NEGATIVE
Ketones, ur: 5 mg/dL — AB
Nitrite: NEGATIVE
Protein, ur: NEGATIVE mg/dL
Specific Gravity, Urine: 1.017 (ref 1.005–1.030)
pH: 5 (ref 5.0–8.0)

## 2019-01-21 LAB — CBC WITH DIFFERENTIAL/PLATELET
Abs Immature Granulocytes: 0.05 10*3/uL (ref 0.00–0.07)
Basophils Absolute: 0 10*3/uL (ref 0.0–0.1)
Basophils Relative: 0 %
Eosinophils Absolute: 0.1 10*3/uL (ref 0.0–0.5)
Eosinophils Relative: 1 %
HCT: 39.9 % (ref 39.0–52.0)
Hemoglobin: 14.4 g/dL (ref 13.0–17.0)
Immature Granulocytes: 1 %
Lymphocytes Relative: 13 %
Lymphs Abs: 1.4 10*3/uL (ref 0.7–4.0)
MCH: 30.9 pg (ref 26.0–34.0)
MCHC: 36.1 g/dL — ABNORMAL HIGH (ref 30.0–36.0)
MCV: 85.6 fL (ref 80.0–100.0)
Monocytes Absolute: 0.8 10*3/uL (ref 0.1–1.0)
Monocytes Relative: 7 %
Neutro Abs: 8.4 10*3/uL — ABNORMAL HIGH (ref 1.7–7.7)
Neutrophils Relative %: 78 %
Platelets: 184 10*3/uL (ref 150–400)
RBC: 4.66 MIL/uL (ref 4.22–5.81)
RDW: 13.6 % (ref 11.5–15.5)
WBC: 10.7 10*3/uL — ABNORMAL HIGH (ref 4.0–10.5)
nRBC: 0 % (ref 0.0–0.2)

## 2019-01-21 LAB — SARS CORONAVIRUS 2 BY RT PCR (HOSPITAL ORDER, PERFORMED IN ~~LOC~~ HOSPITAL LAB): SARS Coronavirus 2: NEGATIVE

## 2019-01-21 LAB — PROTIME-INR
INR: 1 (ref 0.8–1.2)
Prothrombin Time: 12.6 seconds (ref 11.4–15.2)

## 2019-01-21 LAB — LACTIC ACID, PLASMA
Lactic Acid, Venous: 1.1 mmol/L (ref 0.5–1.9)
Lactic Acid, Venous: 1.1 mmol/L (ref 0.5–1.9)

## 2019-01-21 LAB — LIPASE, BLOOD: Lipase: 46 U/L (ref 11–51)

## 2019-01-21 MED ORDER — SODIUM CHLORIDE 0.9% FLUSH
3.0000 mL | Freq: Once | INTRAVENOUS | Status: AC
  Administered 2019-01-21: 3 mL via INTRAVENOUS

## 2019-01-21 MED ORDER — SUCRALFATE 1 GM/10ML PO SUSP
1.0000 g | Freq: Four times a day (QID) | ORAL | Status: DC
  Administered 2019-01-22 – 2019-01-25 (×14): 1 g via ORAL
  Filled 2019-01-21 (×14): qty 10

## 2019-01-21 MED ORDER — ACETAMINOPHEN 325 MG PO TABS
650.0000 mg | ORAL_TABLET | Freq: Once | ORAL | Status: AC | PRN
  Administered 2019-01-21: 650 mg via ORAL
  Filled 2019-01-21: qty 2

## 2019-01-21 MED ORDER — SODIUM CHLORIDE 0.9 % IV BOLUS
2000.0000 mL | Freq: Once | INTRAVENOUS | Status: AC
  Administered 2019-01-21: 2000 mL via INTRAVENOUS

## 2019-01-21 MED ORDER — SODIUM CHLORIDE 0.9 % IV SOLN
2.0000 g | Freq: Once | INTRAVENOUS | Status: AC
  Administered 2019-01-21: 2 g via INTRAVENOUS
  Filled 2019-01-21: qty 2

## 2019-01-21 MED ORDER — ACETAMINOPHEN 650 MG RE SUPP: 650.0000 mg | Freq: Four times a day (QID) | RECTAL | Status: DC | PRN

## 2019-01-21 MED ORDER — ACETAMINOPHEN 325 MG PO TABS
650.0000 mg | ORAL_TABLET | Freq: Four times a day (QID) | ORAL | Status: DC | PRN
  Administered 2019-01-22: 650 mg via ORAL
  Filled 2019-01-21: qty 2

## 2019-01-21 MED ORDER — HYDROMORPHONE HCL 1 MG/ML IJ SOLN
1.0000 mg | Freq: Once | INTRAMUSCULAR | Status: DC
  Filled 2019-01-21: qty 1

## 2019-01-21 MED ORDER — IOHEXOL 300 MG/ML  SOLN
100.0000 mL | Freq: Once | INTRAMUSCULAR | Status: AC | PRN
  Administered 2019-01-21: 100 mL via INTRAVENOUS

## 2019-01-21 MED ORDER — METRONIDAZOLE IN NACL 5-0.79 MG/ML-% IV SOLN
500.0000 mg | Freq: Once | INTRAVENOUS | Status: AC
  Administered 2019-01-21: 500 mg via INTRAVENOUS
  Filled 2019-01-21: qty 100

## 2019-01-21 MED ORDER — ONDANSETRON HCL 4 MG PO TABS: 4.0000 mg | ORAL_TABLET | Freq: Four times a day (QID) | ORAL | Status: DC | PRN

## 2019-01-21 MED ORDER — SUCRALFATE 1 G PO TABS
1.0000 g | ORAL_TABLET | Freq: Once | ORAL | Status: AC
  Administered 2019-01-21: 1 g via ORAL
  Filled 2019-01-21: qty 1

## 2019-01-21 MED ORDER — ONDANSETRON HCL 4 MG/2ML IJ SOLN: 4.0000 mg | Freq: Four times a day (QID) | INTRAMUSCULAR | Status: DC | PRN

## 2019-01-21 MED ORDER — LACTATED RINGERS IV BOLUS
1000.0000 mL | Freq: Once | INTRAVENOUS | Status: AC
  Administered 2019-01-21: 1000 mL via INTRAVENOUS

## 2019-01-21 MED ORDER — HYDROMORPHONE HCL 1 MG/ML IJ SOLN
0.5000 mg | INTRAMUSCULAR | Status: DC | PRN
  Administered 2019-01-22 (×2): 1 mg via INTRAVENOUS
  Filled 2019-01-21 (×2): qty 1

## 2019-01-21 MED ORDER — HYDROMORPHONE HCL 1 MG/ML IJ SOLN: 1.0000 mg | Freq: Once | INTRAMUSCULAR | Status: DC

## 2019-01-21 MED ORDER — PANTOPRAZOLE SODIUM 40 MG IV SOLR
40.0000 mg | Freq: Once | INTRAVENOUS | Status: AC
  Administered 2019-01-21: 40 mg via INTRAVENOUS
  Filled 2019-01-21: qty 40

## 2019-01-21 MED ORDER — PANTOPRAZOLE SODIUM 40 MG IV SOLR
40.0000 mg | Freq: Two times a day (BID) | INTRAVENOUS | Status: DC
  Administered 2019-01-22 – 2019-01-25 (×7): 40 mg via INTRAVENOUS
  Filled 2019-01-21 (×6): qty 40

## 2019-01-21 MED ORDER — SODIUM CHLORIDE 0.9 % IV SOLN
INTRAVENOUS | Status: DC
  Administered 2019-01-21: via INTRAVENOUS

## 2019-01-21 MED ORDER — HYDROMORPHONE HCL 2 MG/ML IJ SOLN
INTRAMUSCULAR | Status: AC
  Administered 2019-01-21: 2 mg
  Filled 2019-01-21: qty 1

## 2019-01-21 MED ORDER — HYDROMORPHONE HCL 1 MG/ML IJ SOLN
1.0000 mg | Freq: Once | INTRAMUSCULAR | Status: AC
  Administered 2019-01-21: 1 mg via INTRAVENOUS
  Filled 2019-01-21: qty 1

## 2019-01-21 NOTE — Progress Notes (Signed)
A consult was received from an ED physician for Cefepime per pharmacy dosing.  The patient's profile has been reviewed for ht/wt/allergies/indication/available labs.   A one time order has been placed for Cefepime 2gm.  Further antibiotics/pharmacy consults should be ordered by admitting physician if indicated.                       Thank you, Elson Clan 01/21/2019  8:10 PM

## 2019-01-21 NOTE — ED Notes (Signed)
ED TO INPATIENT HANDOFF REPORT  ED Nurse Name and Phone #: [facility]   S Name/Age/Gender Mark Silva 43 y.o. male Room/Bed: WA19/WA19  Code Status   Code Status: Full Code  Home/SNF/Other Home Patient oriented to: self, place, time and situation Is this baseline? Yes   Triage Complete: Triage complete  Chief Complaint Abdominal pain  Triage Note Pt reports that his GI doctor sent for possible diverticulitis. Having abd pains and weakness.    Allergies Allergies  Allergen Reactions  . Food Shortness Of Breath and Nausea And Vomiting    Green peas, lentils    Level of Care/Admitting Diagnosis ED Disposition    ED Disposition Condition Comment   Admit  Hospital Area: [facility] [[id number]]  Level of Care: Med-Surg [16]  Covid Evaluation: N/A  Diagnosis: Acute duodenitis [1610960]  Admitting Physician: Briscoe Deutscher [[id number]]  Attending Physician: Briscoe Deutscher [[id number]]  PT Class (Do Not Modify): Observation [104]  PT Acc Code (Do Not Modify): Observation [10022]       B Medical/Surgery History Past Medical History:  Diagnosis Date  . Diverticulitis   . H pylori ulcer   . Pneumonia    Past Surgical History:  Procedure Laterality Date  . BACK SURGERY    . COLON SURGERY       A IV Location/Drains/Wounds Patient Lines/Drains/Airways Status   Active Line/Drains/Airways    Name:   Placement date:   Placement time:   Site:   Days:   Peripheral IV 01/21/19 Left Antecubital   01/21/19    1933    Antecubital   less than 1   Peripheral IV 01/21/19 Right Arm   01/21/19    1959    Arm   less than 1   Incision 11/03/11 Abdomen Other (Comment)   11/03/11    1610     2636          Intake/Output Last 24 hours No intake or output data in the 24 hours ending 01/21/19 2320  Labs/Imaging Results for orders placed or performed during the hospital encounter of 01/21/19 (from the past 48 hour(s))  Lipase, blood     Status: None    Collection Time: 01/21/19  7:28 PM  Result Value Ref Range   Lipase 46 11 - 51 U/L    Comment: Performed at [facility], [address], [place], [place] [address]  Comprehensive metabolic panel     Status: Abnormal   Collection Time: 01/21/19  7:28 PM  Result Value Ref Range   Sodium 128 (L) 135 - 145 mmol/L   Potassium 3.5 3.5 - 5.1 mmol/L   Chloride 94 (L) 98 - 111 mmol/L   CO2 24 22 - 32 mmol/L   Glucose, Bld 168 (H) 70 - 99 mg/dL   BUN 9 6 - 20 mg/dL   Creatinine, Ser 3.08 0.61 - 1.24 mg/dL   Calcium 9.1 8.9 - 65.7 mg/dL   Total Protein 6.9 6.5 - 8.1 g/dL   Albumin 3.9 3.5 - 5.0 g/dL   AST 19 15 - 41 U/L   ALT 21 0 - 44 U/L   Alkaline Phosphatase 55 38 - 126 U/L   Total Bilirubin 1.2 0.3 - 1.2 mg/dL   GFR calc non Af Amer >60 >60 mL/min   GFR calc Af Amer >60 >60 mL/min   Anion gap 10 5 - 15    Comment: Performed at [facility], [address] [place],  [place] [address]  Urinalysis, Routine w reflex microscopic     Status: Abnormal   Collection Time: 01/21/19  7:28 PM  Result Value Ref Range   Color, Urine YELLOW YELLOW   APPearance CLEAR CLEAR   Specific Gravity, Urine 1.017 1.005 - 1.030   pH 5.0 5.0 - 8.0   Glucose, UA 50 (A) NEGATIVE mg/dL   Hgb urine dipstick NEGATIVE NEGATIVE   Bilirubin Urine NEGATIVE NEGATIVE   Ketones, ur 5 (A) NEGATIVE mg/dL   Protein, ur NEGATIVE NEGATIVE mg/dL   Nitrite NEGATIVE NEGATIVE   Leukocytes,Ua TRACE (A) NEGATIVE   RBC / HPF 0-5 0 - 5 RBC/hpf   WBC, UA 0-5 0 - 5 WBC/hpf   Bacteria, UA NONE SEEN NONE SEEN   Mucus PRESENT     Comment: Performed at [facility], [address], [place], [place] [address]  Lactic acid, plasma     Status: None   Collection Time: 01/21/19  7:28 PM  Result Value Ref Range   Lactic Acid, Venous 1.1 0.5 - 1.9 mmol/L    Comment: Performed at [facility], [address] [place], [place] [address]  CBC with Differential      Status: Abnormal   Collection Time: 01/21/19  7:29 PM  Result Value Ref Range   WBC 10.7 (H) 4.0 - 10.5 K/uL   RBC 4.66 4.22 - 5.81 MIL/uL   Hemoglobin 14.4 13.0 - 17.0 g/dL   HCT 91.4 78.2 - 95.6 %   MCV 85.6 80.0 - 100.0 fL   MCH 30.9 26.0 - 34.0 pg   MCHC 36.1 (H) 30.0 - 36.0 g/dL   RDW 21.3 08.6 - 57.8 %   Platelets 184 150 - 400 K/uL   nRBC 0.0 0.0 - 0.2 %   Neutrophils Relative % 78 %   Neutro Abs 8.4 (H) 1.7 - 7.7 K/uL   Lymphocytes Relative 13 %   Lymphs Abs 1.4 0.7 - 4.0 K/uL   Monocytes Relative 7 %   Monocytes Absolute 0.8 0.1 - 1.0 K/uL   Eosinophils Relative 1 %   Eosinophils Absolute 0.1 0.0 - 0.5 K/uL   Basophils Relative 0 %   Basophils Absolute 0.0 0.0 - 0.1 K/uL   Immature Granulocytes 1 %   Abs Immature Granulocytes 0.05 0.00 - 0.07 K/uL    Comment: Performed at [facility], [address], [place], [place] [address]  Protime-INR     Status: None   Collection Time: 01/21/19  7:29 PM  Result Value Ref Range   Prothrombin Time 12.6 11.4 - 15.2 seconds   INR 1.0 0.8 - 1.2    Comment: (NOTE) INR goal varies based on device and disease states. Performed at [facility], [address] [place], [place] [address]   Lactic acid, plasma     Status: None   Collection Time: 01/21/19  8:00 PM  Result Value Ref Range   Lactic Acid, Venous 1.1 0.5 - 1.9 mmol/L    Comment: Performed at [facility], [address], [place], [place] [address]  SARS Coronavirus 2 (CEPHEID - Performed in [facility] hospital lab), Hosp Order     Status: None   Collection Time: 01/21/19  8:05 PM  Result Value Ref Range   SARS Coronavirus 2 NEGATIVE NEGATIVE    Comment: (NOTE) If result is NEGATIVE SARS-CoV-2 target nucleic acids are NOT DETECTED. The SARS-CoV-2 RNA is generally detectable in upper and lower  respiratory specimens  during the acute phase of infection. The lowest  concentration of SARS-CoV-2 viral copies this  assay can detect is 250  copies / mL. A negative result does not preclude SARS-CoV-2 infection  and should not be used as the sole basis for treatment or other  patient management decisions.  A negative result may occur with  improper specimen collection / handling, submission of specimen other  than nasopharyngeal swab, presence of viral mutation(s) within the  areas targeted by this assay, and inadequate number of viral copies  (<250 copies / mL). A negative result must be combined with clinical  observations, patient history, and epidemiological information. If result is POSITIVE SARS-CoV-2 target nucleic acids are DETECTED. The SARS-CoV-2 RNA is generally detectable in upper and lower  respiratory specimens dur ing the acute phase of infection.  Positive  results are indicative of active infection with SARS-CoV-2.  Clinical  correlation with patient history and other diagnostic information is  necessary to determine patient infection status.  Positive results do  not rule out bacterial infection or co-infection with other viruses. If result is PRESUMPTIVE POSTIVE SARS-CoV-2 nucleic acids MAY BE PRESENT.   A presumptive positive result was obtained on the submitted specimen  and confirmed on repeat testing.  While 2019 novel coronavirus  (SARS-CoV-2) nucleic acids may be present in the submitted sample  additional confirmatory testing may be necessary for epidemiological  and / or clinical management purposes  to differentiate between  SARS-CoV-2 and other Sarbecovirus currently known to infect humans.  If clinically indicated additional testing with an alternate test  methodology [phone]([id number]) is advised. The SARS-CoV-2 RNA is generally  detectable in upper and lower respiratory sp ecimens during the acute  phase of infection. The expected result is Negative. Fact Sheet for Patients:  BoilerBrush.com.cyhttps://www.fda.gov/media/136312/download Fact Sheet for Healthcare  Providers: https://pope.com/https://www.fda.gov/media/136313/download This test is not yet approved or cleared by the [place] FDA and has been authorized for detection and/or diagnosis of SARS-CoV-2 by FDA under an Emergency Use Authorization (EUA).  This EUA will remain in effect (meaning this test can be used) for the duration of the COVID-19 declaration under Section 564(b)(1) of the Act, 21 U.S.C. section 360bbb-3(b)(1), unless the authorization is terminated or revoked sooner. Performed at [facility], [address] [place], [place] [address]    Dg Chest 2 View  Result Date: 01/21/2019 CLINICAL DATA:  Right upper quadrant pain EXAM: CHEST - 2 VIEW COMPARISON:  None. FINDINGS: Heart is mildly enlarged. Lungs are clear. No effusions or edema. No acute bony abnormality. IMPRESSION: Mild cardiomegaly.  No active disease. Electronically Signed   By: Charlett NoseKevin  Dover M.D.   On: 01/21/2019 19:34   Ct Abdomen Pelvis W Contrast  Result Date: 01/21/2019 CLINICAL DATA:  Abdominal pain and weakness EXAM: CT ABDOMEN AND PELVIS WITH CONTRAST TECHNIQUE: Multidetector CT imaging of the abdomen and pelvis was performed using the standard protocol following bolus administration of intravenous contrast. CONTRAST:  100mL OMNIPAQUE IOHEXOL 300 MG/ML  SOLN COMPARISON:  08/18/2018 FINDINGS: Lower chest: No acute abnormality. Hepatobiliary: No focal liver abnormality is seen. Low attenuation of the liver as can be seen with hepatic steatosis. No gallstones, gallbladder wall thickening, or biliary dilatation. Pancreas: Peripancreatic inflammatory changes around the pancreatic head. Pancreas enhances normally and homogeneously without areas of necrosis. Spleen: Normal in size without focal abnormality. Adrenals/Urinary Tract: Normal adrenal glands. 6.5 cm hypodense, fluid attenuating left renal mass. Punctate nonobstructing left renal calculi. No obstructive uropathy. Normal bladder. Stomach/Bowel: Stomach is  within normal limits. Appendix appears normal. No bowel dilatation to suggest bowel obstruction. Inflammatory changes around the pancreatic head and along the second and third portions of the duodenum. Vascular/Lymphatic: No significant vascular findings are present. No enlarged abdominal or pelvic lymph nodes. Reproductive: Prostate is unremarkable. Other: No abdominal wall hernia or abnormality. No abdominopelvic ascites. Musculoskeletal: No acute or significant osseous findings. IMPRESSION: 1. Inflammatory changes around the pancreatic head and second and third portions of the duodenum. Differential considerations include mild acute pancreatitis versus duodenitis. Electronically Signed   By: Elige Ko   On: 01/21/2019 21:58    Pending Labs Unresulted Labs (From admission, onward)    Start     Ordered   01/22/19 0500  HIV antibody (Routine Testing)  Tomorrow morning,   R     01/21/19 2307   01/22/19 0500  Comprehensive metabolic panel  Tomorrow morning,   R     01/21/19 2307   01/22/19 0500  CBC WITH DIFFERENTIAL  Tomorrow morning,   R     01/21/19 2307   01/22/19 0500  Lipase, blood  Tomorrow morning,   R     01/21/19 2307   01/21/19 1919  Culture, blood (Routine x 2)  BLOOD CULTURE X 2,   STAT     01/21/19 1919          Vitals/Pain Today's Vitals   01/21/19 1945 01/21/19 2113 01/21/19 2145 01/21/19 2235  BP: 136/75 (!) 161/74    Pulse: 98 85    Resp:  17    Temp:      TempSrc:      SpO2: 95% 94%    Weight:      Height:      PainSc:   5  10-Worst pain ever    Isolation Precautions No active isolations  Medications Medications  metroNIDAZOLE (FLAGYL) IVPB 500 mg (500 mg Intravenous New Bag/Given 01/21/19 2233)  HYDROmorphone (DILAUDID) injection 1 mg (1 mg Intravenous Not Given 01/21/19 2312)  lactated ringers bolus 1,000 mL (has no administration in time range)  HYDROmorphone (DILAUDID) injection 1 mg (0 mg Intravenous Hold 01/21/19 2318)  0.9 %  sodium chloride infusion  (has no administration in time range)  acetaminophen (TYLENOL) tablet 650 mg (has no administration in time range)    Or  acetaminophen (TYLENOL) suppository 650 mg (has no administration in time range)  ondansetron (ZOFRAN) tablet 4 mg (has no administration in time range)    Or  ondansetron (ZOFRAN) injection 4 mg (has no administration in time range)  HYDROmorphone (DILAUDID) injection 0.5-1 mg (has no administration in time range)  pantoprazole (PROTONIX) injection 40 mg (has no administration in time range)  sucralfate (CARAFATE) 1 GM/10ML suspension 1 g (has no administration in time range)  acetaminophen (TYLENOL) tablet 650 mg (650 mg Oral Given 01/21/19 1917)  sodium chloride flush (NS) 0.9 % injection 3 mL (3 mLs Intravenous Given 01/21/19 2051)  ceFEPIme (MAXIPIME) 2 g in sodium chloride 0.9 % 100 mL IVPB (0 g Intravenous Stopped 01/21/19 2232)  HYDROmorphone (DILAUDID) injection 1 mg (1 mg Intravenous Given 01/21/19 2056)  sodium chloride 0.9 % bolus 2,000 mL (2,000 mLs Intravenous New Bag/Given 01/21/19 2051)  iohexol (OMNIPAQUE) 300 MG/ML solution 100 mL (100 mLs Intravenous Contrast Given 01/21/19 2121)  pantoprazole (PROTONIX) injection 40 mg (40 mg Intravenous Given 01/21/19 2313)  sucralfate (CARAFATE) tablet 1 g (1 g Oral Given 01/21/19 2314)  HYDROmorphone (DILAUDID) 2 MG/ML injection (2 mg  Given 01/21/19 2312)  Mobility walks Moderate fall risk   Focused Assessments    R Recommendations: See Admitting Provider Note  Report given to:   Additional Notes:

## 2019-01-21 NOTE — H&P (Signed)
History and Physical    Mcguire Wooden BJY:[id number] DOB: 12-29-1975 DOA: 01/21/2019  PCP: [facility]   Patient coming from: Home   Chief Complaint: Upper abdominal pain, N/V   HPI: Mark Silva is a 43 y.o. male with medical history significant for diverticulitis status post sigmoid colectomy in 2013, and history of recurrent pancreatitis and/or duodenitis, now presenting to the emergency department with severe upper abdominal pain.  Patient had some lower abdominal pain about a week ago, was suspected by his outpatient physician to be suffering recurrent diverticulitis, and was prescribed a course of ciprofloxacin and Flagyl that seemed to resolve this condition.  He returned to his usual state until last night when he developed more severe pain localized to the upper abdomen and associated with nausea and a couple episodes of nonbloody vomiting.  He has not noted any fevers or chills at home, denies diarrhea, and denies any melena or hematochezia.  He no longer takes his PPI or H2 blocker, and continues to use NSAIDs occasionally.  Reports that he drinks alcohol only 3 times a year or so and not recently.  Denies any cough, shortness of breath, dysuria, travel, or sick contacts.  ED Course: Upon arrival to the ED, patient is found to be febrile to 38.8 C, saturating adequately on room air, slightly tachycardic, and with stable blood pressure.  EKG features a sinus rhythm with nonspecific ST abnormality.  Chest x-ray is notable for mild cardiomegaly with no acute cardiopulmonary disease.  CT of the abdomen and pelvis reveals inflammatory changes around the pancreatic head and second and third portions of the duodenum concerning for mild acute pancreatitis versus duodenitis.  Chemistries are notable for sodium of 128 and normal lipase.  CBC features a leukocytosis to 10,700.  Lactic acid is 1.1.  Blood cultures were collected and the patient was treated with Tylenol, cefepime,  Flagyl, IV fluids, IV Protonix, Carafate, and multiple doses of IV Dilaudid.  Patient remains hemodynamically stable, reports some improvement with Dilaudid, but continues to complain of severe pain and will be observed for ongoing evaluation and management.  Review of Systems:  All other systems reviewed and apart from HPI, are negative.  Past Medical History:  Diagnosis Date  . Diverticulitis   . H pylori ulcer   . Pneumonia     Past Surgical History:  Procedure Laterality Date  . BACK SURGERY    . COLON SURGERY       reports that he has never smoked. He has never used smokeless tobacco. He reports current alcohol use. He reports that he does not use drugs.  Allergies  Allergen Reactions  . Food Shortness Of Breath and Nausea And Vomiting    Green peas, lentils    Family History  Problem Relation Age of Onset  . Heart disease Mother   . Hypertension Mother   . Heart disease Father   . Hypertension Father   . Cancer Father        lung  . Hypertension Sister   . Hypertension Brother      Prior to Admission medications   Medication Sig Start Date End Date Taking? Authorizing Provider  ciprofloxacin (CIPRO) 500 MG tablet Take 500 mg by mouth every 12 (twelve) hours. 01/20/19  Yes [provider]  metroNIDAZOLE (FLAGYL) 500 MG tablet Take 500 mg by mouth 3 (three) times daily. 01/20/19  Yes [provider]  oxyCODONE-acetaminophen (PERCOCET/ROXICET) 5-325 MG tablet Take 1 tablet by mouth 2 (two) times  daily as needed for moderate pain.  01/20/19  Yes [provider]  famotidine (PEPCID) 20 MG tablet Take 1 tablet (20 mg total) by mouth 2 (two) times daily. Patient not taking: Reported on 01/21/2019 08/18/18   Pricilla Loveless, MD  HYDROcodone-acetaminophen [facility]) 5-325 MG tablet Take 1 tablet by mouth every 4 (four) hours as needed for severe pain. Patient not taking: Reported on 01/21/2019 08/18/18   Pricilla Loveless, MD  pantoprazole (PROTONIX) 40 MG  tablet Take 1 tablet (40 mg total) by mouth 2 (two) times daily before a meal. Patient not taking: Reported on 01/21/2019 08/18/18   Pricilla Loveless, MD    Physical Exam: Vitals:   01/21/19 1845 01/21/19 1917 01/21/19 1945 01/21/19 2113  BP: 131/86  136/75 (!) 161/74  Pulse: (!) 107  98 85  Resp: (!) 22   17  Temp: (!) 101.9 F (38.8 C)     TempSrc: Oral     SpO2: 96%  95% 94%  Weight:  122.5 kg    Height:  6' (1.829 m)      Constitutional: NAD, calm  Eyes: PERTLA, lids and conjunctivae normal ENMT: Mucous membranes are moist. Posterior pharynx clear of any exudate or lesions.   Neck: normal, supple, no masses, no thyromegaly Respiratory: no wheezing, no crackles. No accessory muscle use.  Cardiovascular: S1 & S2 heard, regular rate and rhythm. No extremity edema.   Abdomen: No distension, soft, tender in upper abdomen, no rebound pain or guarding. Bowel sounds active.  Musculoskeletal: no clubbing / cyanosis. No joint deformity upper and lower extremities.   Skin: no significant rashes, lesions, ulcers. Warm, dry, well-perfused. Neurologic: CN 2-12 grossly intact. Sensation intact. Strength 5/5 in all 4 limbs.  Psychiatric: Alert and oriented x 3. Pleasant and cooperative.    Labs on Admission: I have personally reviewed following labs and imaging studies  CBC: Recent Labs  Lab 01/21/19 1929  WBC 10.7*  NEUTROABS 8.4*  HGB 14.4  HCT 39.9  MCV 85.6  PLT 184   Basic Metabolic Panel: Recent Labs  Lab 01/21/19 1928  NA 128*  K 3.5  CL 94*  CO2 24  GLUCOSE 168*  BUN 9  CREATININE 0.84  CALCIUM 9.1   GFR: Estimated Creatinine Clearance: 150.2 mL/min (by C-G formula based on SCr of 0.84 mg/dL). Liver Function Tests: Recent Labs  Lab 01/21/19 1928  AST 19  ALT 21  ALKPHOS 55  BILITOT 1.2  PROT 6.9  ALBUMIN 3.9   Recent Labs  Lab 01/21/19 1928  LIPASE 46   No results for input(s): AMMONIA in the last 168 hours. Coagulation Profile: Recent Labs  Lab  01/21/19 1929  INR 1.0   Cardiac Enzymes: No results for input(s): CKTOTAL, CKMB, CKMBINDEX, TROPONINI in the last 168 hours. BNP (last 3 results) No results for input(s): PROBNP in the last 8760 hours. HbA1C: No results for input(s): HGBA1C in the last 72 hours. CBG: No results for input(s): GLUCAP in the last 168 hours. Lipid Profile: No results for input(s): CHOL, HDL, LDLCALC, TRIG, CHOLHDL, LDLDIRECT in the last 72 hours. Thyroid Function Tests: No results for input(s): TSH, T4TOTAL, FREET4, T3FREE, THYROIDAB in the last 72 hours. Anemia Panel: No results for input(s): VITAMINB12, FOLATE, FERRITIN, TIBC, IRON, RETICCTPCT in the last 72 hours. Urine analysis:    Component Value Date/Time   COLORURINE YELLOW 01/21/2019 1928   APPEARANCEUR CLEAR 01/21/2019 1928   LABSPEC 1.017 01/21/2019 1928   PHURINE 5.0 01/21/2019 1928   GLUCOSEU 50 (  A) 01/21/2019 1928   HGBUR NEGATIVE 01/21/2019 1928   BILIRUBINUR NEGATIVE 01/21/2019 1928   KETONESUR 5 (A) 01/21/2019 1928   PROTEINUR NEGATIVE 01/21/2019 1928   UROBILINOGEN 0.2 05/20/2012 0215   NITRITE NEGATIVE 01/21/2019 1928   LEUKOCYTESUR TRACE (A) 01/21/2019 1928   Sepsis Labs: @LABRCNTIP (procalcitonin:4,lacticidven:4) ) Recent Results (from the past 240 hour(s))  SARS Coronavirus 2 (CEPHEID - Performed in [facility] hospital lab), Hosp Order     Status: None   Collection Time: 01/21/19  8:05 PM  Result Value Ref Range Status   SARS Coronavirus 2 NEGATIVE NEGATIVE Final    Comment: (NOTE) If result is NEGATIVE SARS-CoV-2 target nucleic acids are NOT DETECTED. The SARS-CoV-2 RNA is generally detectable in upper and lower  respiratory specimens during the acute phase of infection. The lowest  concentration of SARS-CoV-2 viral copies this assay can detect is 250  copies / mL. A negative result does not preclude SARS-CoV-2 infection  and should not be used as the sole basis for treatment or other  patient management decisions.   A negative result may occur with  improper specimen collection / handling, submission of specimen other  than nasopharyngeal swab, presence of viral mutation(s) within the  areas targeted by this assay, and inadequate number of viral copies  (<250 copies / mL). A negative result must be combined with clinical  observations, patient history, and epidemiological information. If result is POSITIVE SARS-CoV-2 target nucleic acids are DETECTED. The SARS-CoV-2 RNA is generally detectable in upper and lower  respiratory specimens dur ing the acute phase of infection.  Positive  results are indicative of active infection with SARS-CoV-2.  Clinical  correlation with patient history and other diagnostic information is  necessary to determine patient infection status.  Positive results do  not rule out bacterial infection or co-infection with other viruses. If result is PRESUMPTIVE POSTIVE SARS-CoV-2 nucleic acids MAY BE PRESENT.   A presumptive positive result was obtained on the submitted specimen  and confirmed on repeat testing.  While 2019 novel coronavirus  (SARS-CoV-2) nucleic acids may be present in the submitted sample  additional confirmatory testing may be necessary for epidemiological  and / or clinical management purposes  to differentiate between  SARS-CoV-2 and other Sarbecovirus currently known to infect humans.  If clinically indicated additional testing with an alternate test  methodology [phone]([id number]) is advised. The SARS-CoV-2 RNA is generally  detectable in upper and lower respiratory sp ecimens during the acute  phase of infection. The expected result is Negative. Fact Sheet for Patients:  BoilerBrush.com.cyhttps://www.fda.gov/media/136312/download Fact Sheet for Healthcare Providers: https://pope.com/https://www.fda.gov/media/136313/download This test is not yet approved or cleared by the [place] FDA and has been authorized for detection and/or diagnosis of SARS-CoV-2 by FDA under an Emergency Use  Authorization (EUA).  This EUA will remain in effect (meaning this test can be used) for the duration of the COVID-19 declaration under Section 564(b)(1) of the Act, 21 U.S.C. section 360bbb-3(b)(1), unless the authorization is terminated or revoked sooner. Performed at [facility], [address] [place], [place] [address]      Radiological Exams on Admission: Dg Chest 2 View  Result Date: 01/21/2019 CLINICAL DATA:  Right upper quadrant pain EXAM: CHEST - 2 VIEW COMPARISON:  None. FINDINGS: Heart is mildly enlarged. Lungs are clear. No effusions or edema. No acute bony abnormality. IMPRESSION: Mild cardiomegaly.  No active disease. Electronically Signed   By: Charlett NoseKevin  Dover M.D.   On: 01/21/2019 19:34   Ct Abdomen  Pelvis W Contrast  Result Date: 01/21/2019 CLINICAL DATA:  Abdominal pain and weakness EXAM: CT ABDOMEN AND PELVIS WITH CONTRAST TECHNIQUE: Multidetector CT imaging of the abdomen and pelvis was performed using the standard protocol following bolus administration of intravenous contrast. CONTRAST:  OMNIPAQUE IOHEXOL 300 MG/ML  SOLN COMPARISON:  08/18/2018 FINDINGS: Lower chest: No acute abnormality. Hepatobiliary: No focal liver abnormality is seen. Low attenuation of the liver as can be seen with hepatic steatosis. No gallstones, gallbladder wall thickening, or biliary dilatation. Pancreas: Peripancreatic inflammatory changes around the pancreatic head. Pancreas enhances normally and homogeneously without areas of necrosis. Spleen: Normal in size without focal abnormality. Adrenals/Urinary Tract: Normal adrenal glands. 6.5 cm hypodense, fluid attenuating left renal mass. Punctate nonobstructing left renal calculi. No obstructive uropathy. Normal bladder. Stomach/Bowel: Stomach is within normal limits. Appendix appears normal. No bowel dilatation to suggest bowel obstruction. Inflammatory changes around the pancreatic head and along the second and third portions of  the duodenum. Vascular/Lymphatic: No significant vascular findings are present. No enlarged abdominal or pelvic lymph nodes. Reproductive: Prostate is unremarkable. Other: No abdominal wall hernia or abnormality. No abdominopelvic ascites. Musculoskeletal: No acute or significant osseous findings. IMPRESSION: 1. Inflammatory changes around the pancreatic head and second and third portions of the duodenum. Differential considerations include mild acute pancreatitis versus duodenitis. Electronically Signed   By: Elige Ko   On: 01/21/2019 21:58    EKG: Independently reviewed. Sinus rhythm, non-specific ST abnormality. No priors available.   Assessment/Plan   1. Acute upper abdominal pain  - Presents with acute pain in upper abdomen similar to prior episodes of pancreatitis and/or duodenitis  - He has a fever in ED with slight leukocytosis, but normal lactate and no evidence for infection  - CT findings suggest mild acute pancreatitis or duodenitis, no cholelithiasis or ductal dilatation; serum lipase and LFT's are normal  - He has hx of duodenitis and was planned for outpatient EGD that was cancelled due to the COVID-19 pandemic  - He had been advised to take PPI and H2-blocker after prior episode of duodenitis but no longer takes the antisecretory agents and continues occasional NSAID  - Treated in ED with IV Protonix and sucralfate  - Continue bowel-rest, pain-control, IV PPI q12h, Carafate   2. SIRS  - Presents with severe upper abdominal pain and is found to have fever and mild leukocytosis with normal lactate and no obvious infectious process  - Blood cultures were collected in ED and he was treated with cefepime and Flagyl  - Possibly secondary to acute pancreatitis or duodenitis as no infectious process identified  - With normal lactate and normal BP, will follow cultures and continue treatment of #1 as above without additional antibiotics    3. Hyponatremia  - Serum sodium is 128 on  admission, appears chronic but slightly worse in setting of abd with N/V and hypovolemia  - Continue fluid-resuscitation, repeat chem panel in am     PPE: Mask, face shield. Patient wearing mask.  DVT prophylaxis: SCD's  Code Status: Full  Family Communication: Discussed with patient  Consults called: none Admission status: Observation     Briscoe Deutscher, MD [facility] Pager [phone]  If 7PM-7AM, please contact night-coverage [email or web address] Password [email or web address]  01/21/2019, 11:07 PM

## 2019-01-21 NOTE — ED Provider Notes (Addendum)
[facility]-EMERGENCY DEPT Provider Note   CSN: [id number] Arrival date & time: 01/21/19  1841    History   Chief Complaint Chief Complaint  Patient presents with  . Abdominal Pain    HPI Mark Silva is a 43 y.o. male.     HPI  43 year old male comes in with chief complaint of abdominal pain.  He has history of H. pylori, diverticulitis status post partial colon resection.  He reports that he is been having abdominal pain for the last 3 days.  The pain was initially periumbilical, but has now started radiating to the right side.  He saw his GI doctor who started him on Cipro and Flagyl, but his symptoms have progressed therefore he was advised to come to the ER.  Pain is moderately severe, sharp and radiating to the back.  Patient has had nausea without emesis.  He also reports subjective fevers, chills and his last bowel movement was on Monday.  Past Medical History:  Diagnosis Date  . Diverticulitis   . H pylori ulcer   . Pneumonia     Patient Active Problem List   Diagnosis Date Noted  . Acute duodenitis 01/21/2019  . Chronic hyponatremia 01/21/2019  . SIRS (systemic inflammatory response syndrome) (HCC) 01/21/2019  . Diverticulitis of colon (without mention of hemorrhage)(562.11) 10/03/2011    Past Surgical History:  Procedure Laterality Date  . BACK SURGERY    . COLON SURGERY          Home Medications    Prior to Admission medications   Medication Sig Start Date End Date Taking? Authorizing Provider  ciprofloxacin (CIPRO) 500 MG tablet Take 500 mg by mouth every 12 (twelve) hours. 01/20/19  Yes [provider]  metroNIDAZOLE (FLAGYL) 500 MG tablet Take 500 mg by mouth 3 (three) times daily. 01/20/19  Yes [provider]  oxyCODONE-acetaminophen (PERCOCET/ROXICET) 5-325 MG tablet Take 1 tablet by mouth 2 (two) times daily as needed for moderate pain.  01/20/19  Yes [provider]  famotidine (PEPCID) 20 MG  tablet Take 1 tablet (20 mg total) by mouth 2 (two) times daily. Patient not taking: Reported on 01/21/2019 08/18/18   Pricilla Loveless, MD  HYDROcodone-acetaminophen [facility]) 5-325 MG tablet Take 1 tablet by mouth every 4 (four) hours as needed for severe pain. Patient not taking: Reported on 01/21/2019 08/18/18   Pricilla Loveless, MD  pantoprazole (PROTONIX) 40 MG tablet Take 1 tablet (40 mg total) by mouth 2 (two) times daily before a meal. Patient not taking: Reported on 01/21/2019 08/18/18   Pricilla Loveless, MD    Family History Family History  Problem Relation Age of Onset  . Heart disease Mother   . Hypertension Mother   . Heart disease Father   . Hypertension Father   . Cancer Father        lung  . Hypertension Sister   . Hypertension Brother     Social History Social History   Tobacco Use  . Smoking status: Never Smoker  . Smokeless tobacco: Never Used  Substance Use Topics  . Alcohol use: Yes    Comment: occ  . Drug use: No     Allergies   Food   Review of Systems Review of Systems  Constitutional: Positive for activity change and fever.  Respiratory: Negative for shortness of breath.   Cardiovascular: Negative for chest pain.  Gastrointestinal: Positive for abdominal pain and nausea.  Allergic/Immunologic: Negative for immunocompromised state.  All other systems reviewed and are  negative.    Physical Exam Updated Vital Signs BP (!) 143/81 (BP Location: Right Arm)   Pulse 87   Temp 99.1 F (37.3 C) (Oral)   Resp 16   Ht 6' (1.829 m)   Wt 122.5 kg   SpO2 95%   BMI 36.62 kg/m   Physical Exam Vitals signs and nursing note reviewed.  Constitutional:      Appearance: He is well-developed.  HENT:     Head: Atraumatic.  Neck:     Musculoskeletal: Neck supple.  Cardiovascular:     Rate and Rhythm: Normal rate.  Pulmonary:     Effort: Pulmonary effort is normal.  Abdominal:     Tenderness: There is generalized abdominal tenderness. There is guarding.  There is no rebound. Negative signs include Murphy's sign and McBurney's sign.     Hernia: No hernia is present.  Skin:    General: Skin is warm.  Neurological:     Mental Status: He is alert and oriented to person, place, and time.      ED Treatments / Results  Labs (all labs ordered are listed, but only abnormal results are displayed) Labs Reviewed  COMPREHENSIVE METABOLIC PANEL - Abnormal; Notable for the following components:      Result Value   Sodium 128 (*)    Chloride 94 (*)    Glucose, Bld 168 (*)    All other components within normal limits  URINALYSIS, ROUTINE W REFLEX MICROSCOPIC - Abnormal; Notable for the following components:   Glucose, UA 50 (*)    Ketones, ur 5 (*)    Leukocytes,Ua TRACE (*)    All other components within normal limits  CBC WITH DIFFERENTIAL/PLATELET - Abnormal; Notable for the following components:   WBC 10.7 (*)    MCHC 36.1 (*)    Neutro Abs 8.4 (*)    All other components within normal limits  SARS CORONAVIRUS 2 (HOSPITAL ORDER, PERFORMED IN [facility])  CULTURE, BLOOD (ROUTINE X 2)  CULTURE, BLOOD (ROUTINE X 2)  LIPASE, BLOOD  LACTIC ACID, PLASMA  LACTIC ACID, PLASMA  PROTIME-INR  HIV ANTIBODY (ROUTINE TESTING W REFLEX)  COMPREHENSIVE METABOLIC PANEL  CBC WITH DIFFERENTIAL/PLATELET  LIPASE, BLOOD    EKG None  Radiology Dg Chest 2 View  Result Date: 01/21/2019 CLINICAL DATA:  Right upper quadrant pain EXAM: CHEST - 2 VIEW COMPARISON:  None. FINDINGS: Heart is mildly enlarged. Lungs are clear. No effusions or edema. No acute bony abnormality. IMPRESSION: Mild cardiomegaly.  No active disease. Electronically Signed   By: Charlett Nose M.D.   On: 01/21/2019 19:34   Ct Abdomen Pelvis W Contrast  Result Date: 01/21/2019 CLINICAL DATA:  Abdominal pain and weakness EXAM: CT ABDOMEN AND PELVIS WITH CONTRAST TECHNIQUE: Multidetector CT imaging of the abdomen and pelvis was performed using the standard protocol following  bolus administration of intravenous contrast. CONTRAST:  OMNIPAQUE IOHEXOL 300 MG/ML  SOLN COMPARISON:  08/18/2018 FINDINGS: Lower chest: No acute abnormality. Hepatobiliary: No focal liver abnormality is seen. Low attenuation of the liver as can be seen with hepatic steatosis. No gallstones, gallbladder wall thickening, or biliary dilatation. Pancreas: Peripancreatic inflammatory changes around the pancreatic head. Pancreas enhances normally and homogeneously without areas of necrosis. Spleen: Normal in size without focal abnormality. Adrenals/Urinary Tract: Normal adrenal glands. 6.5 cm hypodense, fluid attenuating left renal mass. Punctate nonobstructing left renal calculi. No obstructive uropathy. Normal bladder. Stomach/Bowel: Stomach is within normal limits. Appendix appears normal. No bowel dilatation to suggest bowel  obstruction. Inflammatory changes around the pancreatic head and along the second and third portions of the duodenum. Vascular/Lymphatic: No significant vascular findings are present. No enlarged abdominal or pelvic lymph nodes. Reproductive: Prostate is unremarkable. Other: No abdominal wall hernia or abnormality. No abdominopelvic ascites. Musculoskeletal: No acute or significant osseous findings. IMPRESSION: 1. Inflammatory changes around the pancreatic head and second and third portions of the duodenum. Differential considerations include mild acute pancreatitis versus duodenitis. Electronically Signed   By: Elige Ko   On: 01/21/2019 21:58    Procedures Procedures (including critical care time)  Medications Ordered in ED Medications  HYDROmorphone (DILAUDID) injection 1 mg (1 mg Intravenous Not Given 01/21/19 2312)  HYDROmorphone (DILAUDID) injection 1 mg (0 mg Intravenous Hold 01/21/19 2318)  0.9 %  sodium chloride infusion ( Intravenous New Bag/Given 01/21/19 2339)  acetaminophen (TYLENOL) tablet 650 mg (has no administration in time range)    Or  acetaminophen (TYLENOL)  suppository 650 mg (has no administration in time range)  ondansetron (ZOFRAN) tablet 4 mg (has no administration in time range)    Or  ondansetron (ZOFRAN) injection 4 mg (has no administration in time range)  HYDROmorphone (DILAUDID) injection 0.5-1 mg (has no administration in time range)  pantoprazole (PROTONIX) injection 40 mg (has no administration in time range)  sucralfate (CARAFATE) 1 GM/10ML suspension 1 g (has no administration in time range)  acetaminophen (TYLENOL) tablet 650 mg (650 mg Oral Given 01/21/19 1917)  sodium chloride flush (NS) 0.9 % injection 3 mL (3 mLs Intravenous Given 01/21/19 2051)  ceFEPIme (MAXIPIME) 2 g in sodium chloride 0.9 % 100 mL IVPB (0 g Intravenous Stopped 01/21/19 2232)  metroNIDAZOLE (FLAGYL) IVPB 500 mg (0 mg Intravenous Stopped 01/21/19 2341)  HYDROmorphone (DILAUDID) injection 1 mg (1 mg Intravenous Given 01/21/19 2056)  sodium chloride 0.9 % bolus 2,000 mL (0 mLs Intravenous Stopped 01/21/19 2339)  iohexol (OMNIPAQUE) 300 MG/ML solution 100 mL (100 mLs Intravenous Contrast Given 01/21/19 2121)  pantoprazole (PROTONIX) injection 40 mg (40 mg Intravenous Given 01/21/19 2313)  sucralfate (CARAFATE) tablet 1 g (1 g Oral Given 01/21/19 2314)  lactated ringers bolus 1,000 mL (1,000 mLs Intravenous New Bag/Given 01/21/19 2338)  HYDROmorphone (DILAUDID) 2 MG/ML injection (2 mg  Given 01/21/19 2312)     Initial Impression / Assessment and Plan / ED Course  I have reviewed the triage vital signs and the nursing notes.  Pertinent labs & imaging results that were available during my care of the patient were reviewed by me and considered in my medical decision making (see chart for details).        43 year old male comes in with chief complaint of abdominal pain.  He has history of diverticulitis status post partial colon resection and H. pylori gastritis.  On exam he has generalized abdominal tenderness that is worse on the right side.  Patient also has guarding in the  right upper quadrant, but negative Murphy sign.  Code sepsis activated.  Differential diagnosis includes perforated viscus, cholecystitis, appendicitis, intra-abdominal abscess.  Diverticulitis appears less likely. CT scan of the abdomen ordered along with fluids and antibiotics.  11:47 PM CT scan reveals pancreatitis versus duodenitis. Likely the fever is because of the pancreatitis.  Clinically patient is not septic.  Lactic acid is normal. On reassessment patient states that his pain needs to be quite severe.  We will give him another round of Dilaudid, IV fluid and admit.     Final Clinical Impressions(s) / ED Diagnoses   Final diagnoses:  Acute pancreatitis, unspecified complication status, unspecified pancreatitis type  Duodenitis    ED Discharge Orders    None        Derwood KaplanNanavati, Taryne Kiger, MD 01/21/19 [phone]

## 2019-01-21 NOTE — ED Triage Notes (Signed)
Pt reports that his GI doctor sent for possible diverticulitis. Having abd pains and weakness.

## 2019-01-22 DIAGNOSIS — E160 Drug-induced hypoglycemia without coma: Secondary | ICD-10-CM | POA: Diagnosis not present

## 2019-01-22 DIAGNOSIS — E861 Hypovolemia: Secondary | ICD-10-CM | POA: Diagnosis present

## 2019-01-22 DIAGNOSIS — Y92230 Patient room in hospital as the place of occurrence of the external cause: Secondary | ICD-10-CM | POA: Diagnosis not present

## 2019-01-22 DIAGNOSIS — Z79899 Other long term (current) drug therapy: Secondary | ICD-10-CM | POA: Diagnosis not present

## 2019-01-22 DIAGNOSIS — K2980 Duodenitis without bleeding: Secondary | ICD-10-CM | POA: Diagnosis present

## 2019-01-22 DIAGNOSIS — K859 Acute pancreatitis without necrosis or infection, unspecified: Secondary | ICD-10-CM | POA: Diagnosis present

## 2019-01-22 DIAGNOSIS — Z8701 Personal history of pneumonia (recurrent): Secondary | ICD-10-CM | POA: Diagnosis not present

## 2019-01-22 DIAGNOSIS — R6510 Systemic inflammatory response syndrome (SIRS) of non-infectious origin without acute organ dysfunction: Secondary | ICD-10-CM | POA: Diagnosis present

## 2019-01-22 DIAGNOSIS — Z91018 Allergy to other foods: Secondary | ICD-10-CM | POA: Diagnosis not present

## 2019-01-22 DIAGNOSIS — Z20828 Contact with and (suspected) exposure to other viral communicable diseases: Secondary | ICD-10-CM | POA: Diagnosis present

## 2019-01-22 DIAGNOSIS — Z9049 Acquired absence of other specified parts of digestive tract: Secondary | ICD-10-CM | POA: Diagnosis not present

## 2019-01-22 DIAGNOSIS — E871 Hypo-osmolality and hyponatremia: Secondary | ICD-10-CM | POA: Diagnosis present

## 2019-01-22 DIAGNOSIS — E781 Pure hyperglyceridemia: Secondary | ICD-10-CM | POA: Diagnosis present

## 2019-01-22 DIAGNOSIS — Z8619 Personal history of other infectious and parasitic diseases: Secondary | ICD-10-CM | POA: Diagnosis not present

## 2019-01-22 DIAGNOSIS — Z801 Family history of malignant neoplasm of trachea, bronchus and lung: Secondary | ICD-10-CM | POA: Diagnosis not present

## 2019-01-22 DIAGNOSIS — E876 Hypokalemia: Secondary | ICD-10-CM | POA: Diagnosis not present

## 2019-01-22 DIAGNOSIS — T383X5A Adverse effect of insulin and oral hypoglycemic [antidiabetic] drugs, initial encounter: Secondary | ICD-10-CM | POA: Diagnosis not present

## 2019-01-22 LAB — LIPID PANEL
Cholesterol: 432 mg/dL — ABNORMAL HIGH (ref 0–200)
LDL Cholesterol: UNDETERMINED mg/dL (ref 0–99)
Triglycerides: 1616 mg/dL — ABNORMAL HIGH (ref <150)
VLDL: UNDETERMINED mg/dL (ref 0–40)

## 2019-01-22 LAB — CBC WITH DIFFERENTIAL/PLATELET
Abs Immature Granulocytes: 0.06 10*3/uL (ref 0.00–0.07)
Basophils Absolute: 0 10*3/uL (ref 0.0–0.1)
Basophils Relative: 0 %
Eosinophils Absolute: 0.1 10*3/uL (ref 0.0–0.5)
Eosinophils Relative: 1 %
HCT: 35.8 % — ABNORMAL LOW (ref 39.0–52.0)
Hemoglobin: 12.2 g/dL — ABNORMAL LOW (ref 13.0–17.0)
Immature Granulocytes: 1 %
Lymphocytes Relative: 13 %
Lymphs Abs: 1.3 10*3/uL (ref 0.7–4.0)
MCH: 30 pg (ref 26.0–34.0)
MCHC: 34.1 g/dL (ref 30.0–36.0)
MCV: 88.2 fL (ref 80.0–100.0)
Monocytes Absolute: 0.8 10*3/uL (ref 0.1–1.0)
Monocytes Relative: 8 %
Neutro Abs: 7.4 10*3/uL (ref 1.7–7.7)
Neutrophils Relative %: 77 %
Platelets: 141 10*3/uL — ABNORMAL LOW (ref 150–400)
RBC: 4.06 MIL/uL — ABNORMAL LOW (ref 4.22–5.81)
RDW: 13.9 % (ref 11.5–15.5)
WBC: 9.6 10*3/uL (ref 4.0–10.5)
nRBC: 0 % (ref 0.0–0.2)

## 2019-01-22 LAB — GLUCOSE, CAPILLARY
Glucose-Capillary: 131 mg/dL — ABNORMAL HIGH (ref 70–99)
Glucose-Capillary: 54 mg/dL — ABNORMAL LOW (ref 70–99)
Glucose-Capillary: 55 mg/dL — ABNORMAL LOW (ref 70–99)
Glucose-Capillary: 58 mg/dL — ABNORMAL LOW (ref 70–99)
Glucose-Capillary: 64 mg/dL — ABNORMAL LOW (ref 70–99)
Glucose-Capillary: 67 mg/dL — ABNORMAL LOW (ref 70–99)
Glucose-Capillary: 74 mg/dL (ref 70–99)
Glucose-Capillary: 79 mg/dL (ref 70–99)
Glucose-Capillary: 82 mg/dL (ref 70–99)

## 2019-01-22 LAB — COMPREHENSIVE METABOLIC PANEL
ALT: 17 U/L (ref 0–44)
AST: 13 U/L — ABNORMAL LOW (ref 15–41)
Albumin: 3.3 g/dL — ABNORMAL LOW (ref 3.5–5.0)
Alkaline Phosphatase: 46 U/L (ref 38–126)
Anion gap: 9 (ref 5–15)
BUN: 7 mg/dL (ref 6–20)
CO2: 24 mmol/L (ref 22–32)
Calcium: 8.3 mg/dL — ABNORMAL LOW (ref 8.9–10.3)
Chloride: 98 mmol/L (ref 98–111)
Creatinine, Ser: 0.72 mg/dL (ref 0.61–1.24)
GFR calc Af Amer: >60 mL/min (ref >60)
GFR calc non Af Amer: >60 mL/min (ref >60)
Glucose, Bld: 160 mg/dL — ABNORMAL HIGH (ref 70–99)
Potassium: 3.5 mmol/L (ref 3.5–5.1)
Sodium: 131 mmol/L — ABNORMAL LOW (ref 135–145)
Total Bilirubin: 0.8 mg/dL (ref 0.3–1.2)
Total Protein: 6 g/dL — ABNORMAL LOW (ref 6.5–8.1)

## 2019-01-22 LAB — HIV ANTIBODY (ROUTINE TESTING W REFLEX): HIV Screen 4th Generation wRfx: NONREACTIVE

## 2019-01-22 LAB — MRSA PCR SCREENING: MRSA by PCR: NEGATIVE

## 2019-01-22 LAB — LDL CHOLESTEROL, DIRECT

## 2019-01-22 LAB — LIPASE, BLOOD: Lipase: 35 U/L (ref 11–51)

## 2019-01-22 LAB — TRIGLYCERIDES: Triglycerides: 1458 mg/dL — ABNORMAL HIGH (ref <150)

## 2019-01-22 MED ORDER — SODIUM CHLORIDE 0.9% FLUSH: 9.0000 mL | INTRAVENOUS | Status: DC | PRN

## 2019-01-22 MED ORDER — DIPHENHYDRAMINE HCL 12.5 MG/5ML PO ELIX: 12.5000 mg | ORAL_SOLUTION | Freq: Four times a day (QID) | ORAL | Status: DC | PRN

## 2019-01-22 MED ORDER — MORPHINE SULFATE 2 MG/ML IV SOLN
INTRAVENOUS | Status: DC
  Administered 2019-01-22: 9.5 mg via INTRAVENOUS
  Administered 2019-01-22: 2 mg via INTRAVENOUS
  Administered 2019-01-22: 18 mg via INTRAVENOUS
  Administered 2019-01-23: 4 mg via INTRAVENOUS
  Administered 2019-01-23: 12 mg via INTRAVENOUS
  Administered 2019-01-23: 04:00:00 via INTRAVENOUS
  Administered 2019-01-23: 21 mL via INTRAVENOUS
  Administered 2019-01-23: 18.31 mg via INTRAVENOUS
  Administered 2019-01-23: 4.5 mL via INTRAVENOUS
  Filled 2019-01-22 (×3): qty 30

## 2019-01-22 MED ORDER — DIPHENHYDRAMINE HCL 50 MG/ML IJ SOLN: 12.5000 mg | Freq: Four times a day (QID) | INTRAMUSCULAR | Status: DC | PRN

## 2019-01-22 MED ORDER — DEXTROSE 5 % IV SOLN
INTRAVENOUS | Status: DC
  Administered 2019-01-22: 15:00:00 via INTRAVENOUS

## 2019-01-22 MED ORDER — DEXTROSE 10 % IV SOLN
INTRAVENOUS | Status: DC
  Administered 2019-01-22 – 2019-01-24 (×9): via INTRAVENOUS

## 2019-01-22 MED ORDER — NALOXONE HCL 0.4 MG/ML IJ SOLN: 0.4000 mg | INTRAMUSCULAR | Status: DC | PRN

## 2019-01-22 MED ORDER — INSULIN (MYXREDLIN) INFUSION FOR HYPERTRIGLYCERIDEMIA
0.0500 [IU]/kg/h | INTRAVENOUS | Status: DC
  Administered 2019-01-22: 0.1 [IU]/kg/h via INTRAVENOUS
  Administered 2019-01-22 – 2019-01-23 (×2): 0.05 [IU]/kg/h via INTRAVENOUS
  Filled 2019-01-22 (×5): qty 100

## 2019-01-22 NOTE — Progress Notes (Signed)
Hypoglycemic Event  CBG: 64 on Insulin drip  Treatment: D/C D5 drip and start D10 at 100 mL/hour.   Symptoms: None at this time  Follow-up CBG: Will recheck at 1930  Possible Reasons for Event: Insulin drip for pancreatitis  Comments/MD notified: Adhikari paged, verbal orders received.     Mark Silva

## 2019-01-22 NOTE — Consult Note (Signed)
Reason for Consult: Recurrent acute pancreatitis Referring Physician: [facility]  Ivar Savitz HPI: This is a 43 year old male who is well-known to me for his history of recurrent diverticulitis and more recently pancreatitis.  His last episode was this past December, but he had a similar attack in 08/2017.  This is the first time that he required hospitalization.  The patient was evaluated in the office on Monday with acute abdominal pain.  Clinically, he felt that it was a recurrent diverticulitis attack and he did not feel that it was consistent with the pancreatitis pain.  The pain was in the mid abdomen with some right sided involvement.  There was also the question of an appendicitis.  Emperically he was treated with Cipro and Flagyl, but the next day, Tuesday, he presented to the ER with worsening pain.  His pain did improve initially.  A CT scan in the ER shows the same findings are the prior two CT scans.  The plan earlier this year was to perform an EUS to further evaluate the pancreatitis, but Covid-19 resulted in a cancellation of the procedure.  There was no evidence of any sludge or stones in the biliary tract and his liver enzymes are normal as well as the lipase.  Past Medical History:  Diagnosis Date  . Diverticulitis   . H pylori ulcer   . Pneumonia     Past Surgical History:  Procedure Laterality Date  . BACK SURGERY    . COLON SURGERY      Family History  Problem Relation Age of Onset  . Heart disease Mother   . Hypertension Mother   . Heart disease Father   . Hypertension Father   . Cancer Father        lung  . Hypertension Sister   . Hypertension Brother     Social History:  reports that he has never smoked. He has never used smokeless tobacco. He reports current alcohol use. He reports that he does not use drugs.  Allergies:  Allergies  Allergen Reactions  . Food Shortness Of Breath and Nausea And Vomiting    Green peas, lentils    Medications:   Scheduled: .  HYDROmorphone (DILAUDID) injection  1 mg Intravenous Once  .  HYDROmorphone (DILAUDID) injection  1 mg Intravenous Once  . morphine   Intravenous Q4H  . pantoprazole (PROTONIX) IV  40 mg Intravenous Q12H  . sucralfate  1 g Oral Q6H   Continuous: . sodium chloride 125 mL/hr at 01/21/19 2339    Results for orders placed or performed during the hospital encounter of 01/21/19 (from the past 24 hour(s))  Culture, blood (Routine x 2)     Status: None (Preliminary result)   Collection Time: 01/21/19  7:19 PM  Result Value Ref Range   Specimen Description      BLOOD LEFT ANTECUBITAL Performed at [facility], [address] [place], [place] [address]    Special Requests      BOTTLES DRAWN AEROBIC AND ANAEROBIC Blood Culture results may not be optimal due to an excessive volume of blood received in culture bottles Performed at [facility], [address] [place], [place] [address]    Culture      NO GROWTH < 12 HOURS Performed at [facility], [address] [place], [place] [address]    Report Status PENDING   Lipase, blood     Status: None   Collection Time: 01/21/19  7:28 PM  Result Value Ref Range   Lipase 46 11 - 51 U/L  Comprehensive metabolic panel     Status: Abnormal   Collection Time: 01/21/19  7:28 PM  Result Value Ref Range   Sodium 128 (L) 135 - 145 mmol/L   Potassium 3.5 3.5 - 5.1 mmol/L   Chloride 94 (L) 98 - 111 mmol/L   CO2 24 22 - 32 mmol/L   Glucose, Bld 168 (H) 70 - 99 mg/dL   BUN 9 6 - 20 mg/dL   Creatinine, Ser 1.61 0.61 - 1.24 mg/dL   Calcium 9.1 8.9 - 09.6 mg/dL   Total Protein 6.9 6.5 - 8.1 g/dL   Albumin 3.9 3.5 - 5.0 g/dL   AST 19 15 - 41 U/L   ALT 21 0 - 44 U/L   Alkaline Phosphatase 55 38 - 126 U/L   Total Bilirubin 1.2 0.3 - 1.2 mg/dL   GFR calc non Af Amer >60 >60 mL/min   GFR calc Af Amer >60 >60 mL/min   Anion gap 10 5 - 15  Urinalysis, Routine w reflex microscopic     Status:  Abnormal   Collection Time: 01/21/19  7:28 PM  Result Value Ref Range   Color, Urine YELLOW YELLOW   APPearance CLEAR CLEAR   Specific Gravity, Urine 1.017 1.005 - 1.030   pH 5.0 5.0 - 8.0   Glucose, UA 50 (A) NEGATIVE mg/dL   Hgb urine dipstick NEGATIVE NEGATIVE   Bilirubin Urine NEGATIVE NEGATIVE   Ketones, ur 5 (A) NEGATIVE mg/dL   Protein, ur NEGATIVE NEGATIVE mg/dL   Nitrite NEGATIVE NEGATIVE   Leukocytes,Ua TRACE (A) NEGATIVE   RBC / HPF 0-5 0 - 5 RBC/hpf   WBC, UA 0-5 0 - 5 WBC/hpf   Bacteria, UA NONE SEEN NONE SEEN   Mucus PRESENT   Lactic acid, plasma     Status: None   Collection Time: 01/21/19  7:28 PM  Result Value Ref Range   Lactic Acid, Venous 1.1 0.5 - 1.9 mmol/L  CBC with Differential     Status: Abnormal   Collection Time: 01/21/19  7:29 PM  Result Value Ref Range   WBC 10.7 (H) 4.0 - 10.5 K/uL   RBC 4.66 4.22 - 5.81 MIL/uL   Hemoglobin 14.4 13.0 - 17.0 g/dL   HCT 04.5 40.9 - 81.1 %   MCV 85.6 80.0 - 100.0 fL   MCH 30.9 26.0 - 34.0 pg   MCHC 36.1 (H) 30.0 - 36.0 g/dL   RDW 91.4 78.2 - 95.6 %   Platelets 184 150 - 400 K/uL   nRBC 0.0 0.0 - 0.2 %   Neutrophils Relative % 78 %   Neutro Abs 8.4 (H) 1.7 - 7.7 K/uL   Lymphocytes Relative 13 %   Lymphs Abs 1.4 0.7 - 4.0 K/uL   Monocytes Relative 7 %   Monocytes Absolute 0.8 0.1 - 1.0 K/uL   Eosinophils Relative 1 %   Eosinophils Absolute 0.1 0.0 - 0.5 K/uL   Basophils Relative 0 %   Basophils Absolute 0.0 0.0 - 0.1 K/uL   Immature Granulocytes 1 %   Abs Immature Granulocytes 0.05 0.00 - 0.07 K/uL  Protime-INR     Status: None   Collection Time: 01/21/19  7:29 PM  Result Value Ref Range   Prothrombin Time 12.6 11.4 - 15.2 seconds   INR 1.0 0.8 - 1.2  Lactic acid, plasma     Status: None   Collection Time: 01/21/19  8:00 PM  Result Value Ref Range   Lactic Acid, Venous 1.1 0.5 - 1.9 mmol/L  Culture, blood (Routine x 2)     Status: None (Preliminary result)   Collection Time: 01/21/19  8:04 PM  Result  Value Ref Range   Specimen Description      BLOOD RIGHT ARM Performed at [facility], [address] [place], [place] [address]    Special Requests      BOTTLES DRAWN AEROBIC AND ANAEROBIC Blood Culture adequate volume Performed at [facility], [address], [place], [place] [address]    Culture      NO GROWTH < 12 HOURS Performed at [facility], [address] [place], [place] [address]    Report Status PENDING   SARS Coronavirus 2 (CEPHEID - Performed in [facility] hospital lab), Hosp Order     Status: None   Collection Time: 01/21/19  8:05 PM  Result Value Ref Range   SARS Coronavirus 2 NEGATIVE NEGATIVE  Comprehensive metabolic panel     Status: Abnormal   Collection Time: 01/22/19  4:15 AM  Result Value Ref Range   Sodium 131 (L) 135 - 145 mmol/L   Potassium 3.5 3.5 - 5.1 mmol/L   Chloride 98 98 - 111 mmol/L   CO2 24 22 - 32 mmol/L   Glucose, Bld 160 (H) 70 - 99 mg/dL   BUN 7 6 - 20 mg/dL   Creatinine, Ser 9.74 0.61 - 1.24 mg/dL   Calcium 8.3 (L) 8.9 - 10.3 mg/dL   Total Protein 6.0 (L) 6.5 - 8.1 g/dL   Albumin 3.3 (L) 3.5 - 5.0 g/dL   AST 13 (L) 15 - 41 U/L   ALT 17 0 - 44 U/L   Alkaline Phosphatase 46 38 - 126 U/L   Total Bilirubin 0.8 0.3 - 1.2 mg/dL   GFR calc non Af Amer >60 >60 mL/min   GFR calc Af Amer >60 >60 mL/min   Anion gap 9 5 - 15  CBC WITH DIFFERENTIAL     Status: Abnormal   Collection Time: 01/22/19  4:15 AM  Result Value Ref Range   WBC 9.6 4.0 - 10.5 K/uL   RBC 4.06 (L) 4.22 - 5.81 MIL/uL   Hemoglobin 12.2 (L) 13.0 - 17.0 g/dL   HCT 71.8 (L) 55.0 - 15.8 %   MCV 88.2 80.0 - 100.0 fL   MCH 30.0 26.0 - 34.0 pg   MCHC 34.1 30.0 - 36.0 g/dL   RDW 68.2 57.4 - 93.5 %   Platelets 141 (L) 150 - 400 K/uL   nRBC 0.0 0.0 - 0.2 %   Neutrophils Relative % 77 %   Neutro Abs 7.4 1.7 - 7.7 K/uL   Lymphocytes Relative 13 %   Lymphs Abs 1.3 0.7 - 4.0 K/uL   Monocytes Relative 8 %   Monocytes Absolute 0.8  0.1 - 1.0 K/uL   Eosinophils Relative 1 %   Eosinophils Absolute 0.1 0.0 - 0.5 K/uL   Basophils Relative 0 %   Basophils Absolute 0.0 0.0 - 0.1 K/uL   Immature Granulocytes 1 %   Abs Immature Granulocytes 0.06 0.00 - 0.07 K/uL  Lipase, blood     Status: None   Collection Time: 01/22/19  4:15 AM  Result Value Ref Range   Lipase 35 11 - 51 U/L     Dg Chest 2 View  Result Date: 01/21/2019 CLINICAL DATA:  Right upper quadrant pain EXAM: CHEST -  2 VIEW COMPARISON:  None. FINDINGS: Heart is mildly enlarged. Lungs are clear. No effusions or edema. No acute bony abnormality. IMPRESSION: Mild cardiomegaly.  No active disease. Electronically Signed   By: Charlett Nose M.D.   On: 01/21/2019 19:34   Ct Abdomen Pelvis W Contrast  Result Date: 01/21/2019 CLINICAL DATA:  Abdominal pain and weakness EXAM: CT ABDOMEN AND PELVIS WITH CONTRAST TECHNIQUE: Multidetector CT imaging of the abdomen and pelvis was performed using the standard protocol following bolus administration of intravenous contrast. CONTRAST:  OMNIPAQUE IOHEXOL 300 MG/ML  SOLN COMPARISON:  08/18/2018 FINDINGS: Lower chest: No acute abnormality. Hepatobiliary: No focal liver abnormality is seen. Low attenuation of the liver as can be seen with hepatic steatosis. No gallstones, gallbladder wall thickening, or biliary dilatation. Pancreas: Peripancreatic inflammatory changes around the pancreatic head. Pancreas enhances normally and homogeneously without areas of necrosis. Spleen: Normal in size without focal abnormality. Adrenals/Urinary Tract: Normal adrenal glands. 6.5 cm hypodense, fluid attenuating left renal mass. Punctate nonobstructing left renal calculi. No obstructive uropathy. Normal bladder. Stomach/Bowel: Stomach is within normal limits. Appendix appears normal. No bowel dilatation to suggest bowel obstruction. Inflammatory changes around the pancreatic head and along the second and third portions of the duodenum. Vascular/Lymphatic:  No significant vascular findings are present. No enlarged abdominal or pelvic lymph nodes. Reproductive: Prostate is unremarkable. Other: No abdominal wall hernia or abnormality. No abdominopelvic ascites. Musculoskeletal: No acute or significant osseous findings. IMPRESSION: 1. Inflammatory changes around the pancreatic head and second and third portions of the duodenum. Differential considerations include mild acute pancreatitis versus duodenitis. Electronically Signed   By: Elige Ko   On: 01/21/2019 21:58    ROS:  As stated above in the HPI otherwise negative.  Blood pressure 129/84, pulse 86, temperature 98.6 F (37 C), temperature source Oral, resp. rate 18, height 6' (1.829 m), weight 122.5 kg, SpO2 98 %.    PE: Gen: NAD, Alert and Oriented HEENT:  [place]/AT, EOMI Neck: Supple, no LAD Lungs: CTA Bilaterally CV: RRR without M/G/R ABM: Soft, tender in the upper abdomen, +BS Ext: No C/C/E  Assessment/Plan: 1) Acute recurrent pancreatitis. 2) Upper abdominal pain.   His pain does respond to the pain medications, but it wears off to quickly.  He will be better served with a PCA.  Plan: 1) PCA. 2) Maintain NPO. 3) Check triglyceride level. Hever Castilleja D 01/22/2019, 8:16 AM

## 2019-01-22 NOTE — Significant Event (Signed)
Noted low CBGs. Will decrease insulin drip to rate 0.05 units/kg/hr. Maintain D10 at 100 ml/hr for now.Can decrease D10 rate if CBG goes up. Triglyceride level slightly improved.

## 2019-01-22 NOTE — Progress Notes (Signed)
Hypoglycemic Event  CBG: 67 on Insulin drip  Treatment: Increase D5 rate to 150 mL/hour.  Symptoms: Feels jittery  Will recheck at 1730   Possible Reasons for Event: Insulin drip for pancreatitis  Comments/MD notified: Adhikari paged, verbal orders received.     Lavonne Chick Darrly Loberg

## 2019-01-22 NOTE — Progress Notes (Signed)
Paged Schorr, NP via amion to make aware that CBG 58 after d10 was started at 1847 rate 100 cc/H.

## 2019-01-22 NOTE — Progress Notes (Signed)
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  He reported associated nausea and vomiting.  CT abdomen/pelvis revealed inflammatory changes around the pancreatic head and second/third portion of the duodenum concerning for mild acute pancreatitis versus duodenitis.  GI consulted.  Lipid panel showed elevated triglyceride level today.  Patient will be started on insulin drip and moved to ICU.   Assessment & Plan:   Principal Problem:   Acute duodenitis Active Problems:   Chronic hyponatremia   SIRS (systemic inflammatory response syndrome) (HCC)  Acute pancreatitis: CT finding as above.  Most likely etiology is elevated triglyceride level.  Patient does not drink alcohol. Patient has been started on PCA pump for abdominal pain.  GI following.  Continue n.p.o. Will be started on insulin drip for hypertriglyceridemia.  Will check triglyceride level every 12 hours.  Continue D5W to prevent hypoglycemia.  Check CBG every 2 hours.  Abdominal pain: Much better after started on PCA pump.  Still complains of mild epigastric pain.  Also on IV Protonix and Carafate.  Continue bowel rest.  History of diverticulitis: Status post sigmoid colectomy in 2013.  Hyponatremia: Mild.  We will continue to monitor.         DVT prophylaxis:SCD Code Status: Full Family Communication: None present at the bedside Disposition Plan: Home after resolution of abdominal pain   Consultants: GI  Procedures: None  Antimicrobials:  Anti-infectives (From admission, onward)   Start     Dose/Rate Route Frequency Ordered Stop   01/21/19 2000  ceFEPIme (MAXIPIME) 2 g in sodium chloride 0.9 %  100 mL IVPB     2 g 200 mL/hr over 30 Minutes Intravenous  Once 01/21/19 1952 01/21/19 2232   01/21/19 2000  metroNIDAZOLE (FLAGYL) IVPB 500 mg     500 mg 100 mL/hr over 60 Minutes Intravenous  Once 01/21/19 1952 01/21/19 2341      Subjective:  Patient seen and examined the bedside this morning.  Hemodynamically stable.  Looked comfortable during my evaluation.  Abdomen pain is much better after he was started on PCA pump.  Denies any nausea or vomiting.  Objective: Vitals:   01/22/19 0912 01/22/19 0929 01/22/19 1144 01/22/19 1145  BP: 123/76 121/81    Pulse: 82 81    Resp: Temp: 99.8 F (37.7 C) 99.7 F (37.6 C)    TempSrc: Oral Oral    SpO2: 97% 96% 98% 98%  Weight:      Height:        Intake/Output Summary (Last 24 hours) at 01/22/2019 1348 Last data filed at 01/22/2019 1000 Gross per 24 hour  Intake 1230.17 ml  Output --  Net 1230.17 ml   Filed Weights   01/21/19 1917  Weight: 122.5 kg    Examination:  General exam: Appears calm and comfortable ,Not in distress,average built HEENT:PERRL,Oral mucosa moist, Ear/Nose normal on gross exam Respiratory system: Bilateral equal air entry, normal vesicular breath sounds, no wheezes or crackles  Cardiovascular system: S1 & S2 heard, RRR. No JVD, murmurs, rubs, gallops or clicks. No pedal edema. Gastrointestinal system: Abdomen is nondistended, soft . No organomegaly or masses felt. Normal bowel sounds heard. Mild tenderness on the epigastric region. Central nervous system: Alert  and oriented. No focal neurological deficits. Extremities: No edema, no clubbing ,no cyanosis, distal peripheral pulses palpable. Skin: No rashes, lesions or ulcers,no icterus ,no pallor MSK: Normal muscle bulk,tone ,power Psychiatry: Judgement and insight appear normal. Mood & affect appropriate.     Data Reviewed: I have personally reviewed following labs and imaging studies  CBC: Recent Labs  Lab 01/21/19 1929 01/22/19 0415   WBC 10.7* 9.6  NEUTROABS 8.4* 7.4  HGB 14.4 12.2*  HCT 39.9 35.8*  MCV 85.6 88.2  PLT 184 141*   Basic Metabolic Panel: Recent Labs  Lab 01/21/19 1928 01/22/19 0415  NA 128* 131*  K 3.5 3.5  CL 94* 98  CO2 24 24  GLUCOSE 168* 160*  BUN 9 7  CREATININE 0.84 0.72  CALCIUM 9.1 8.3*   GFR: Estimated Creatinine Clearance: 157.7 mL/min (by C-G formula based on SCr of 0.72 mg/dL). Liver Function Tests: Recent Labs  Lab 01/21/19 1928 01/22/19 0415  AST 19 13*  ALT 21 17  ALKPHOS 55 46  BILITOT 1.2 0.8  PROT 6.9 6.0*  ALBUMIN 3.9 3.3*   Recent Labs  Lab 01/21/19 1928 01/22/19 0415  LIPASE 46 35   No results for input(s): AMMONIA in the last 168 hours. Coagulation Profile: Recent Labs  Lab 01/21/19 1929  INR 1.0   Cardiac Enzymes: No results for input(s): CKTOTAL, CKMB, CKMBINDEX, TROPONINI in the last 168 hours. BNP (last 3 results) No results for input(s): PROBNP in the last 8760 hours. HbA1C: No results for input(s): HGBA1C in the last 72 hours. CBG: No results for input(s): GLUCAP in the last 168 hours. Lipid Profile: Recent Labs    01/22/19 0415  CHOL 432*  HDL NOT REPORTED DUE TO HIGH TRIGLYCERIDES  LDLCALC UNABLE TO CALCULATE IF TRIGLYCERIDE OVER 400 mg/dL  TRIG 0,177*  CHOLHDL NOT REPORTED DUE TO HIGH TRIGLYCERIDES  LDLDIRECT NOT CALCULATED   Thyroid Function Tests: No results for input(s): TSH, T4TOTAL, FREET4, T3FREE, THYROIDAB in the last 72 hours. Anemia Panel: No results for input(s): VITAMINB12, FOLATE, FERRITIN, TIBC, IRON, RETICCTPCT in the last 72 hours. Sepsis Labs: Recent Labs  Lab 01/21/19 1928 01/21/19 2000  LATICACIDVEN 1.1 1.1    Recent Results (from the past 240 hour(s))  Culture, blood (Routine x 2)     Status: None (Preliminary result)   Collection Time: 01/21/19  7:19 PM  Result Value Ref Range Status   Specimen Description   Final    BLOOD LEFT ANTECUBITAL Performed at [facility], [address], [place], [place] [address]    Special Requests   Final    BOTTLES DRAWN AEROBIC AND ANAEROBIC Blood Culture results may not be optimal due to an excessive volume of blood received in culture bottles Performed at [facility], [address] [place], [place] [address]    Culture   Final    NO GROWTH < 12 HOURS Performed at [facility], [address] [place], [place] [address]    Report Status PENDING  Incomplete  Culture, blood (Routine x 2)     Status: None (Preliminary result)   Collection Time: 01/21/19  8:04 PM  Result Value Ref Range Status   Specimen Description   Final    BLOOD RIGHT ARM Performed at [facility], [address], [place], [place] [address]    Special Requests   Final    BOTTLES DRAWN AEROBIC AND ANAEROBIC Blood Culture adequate volume Performed at [facility], [address], [place], [place] [address]    Culture   Final    NO GROWTH < 12 HOURS Performed at [facility], [address] [place], [place] [address]    Report Status PENDING  Incomplete  SARS Coronavirus 2 (CEPHEID - Performed in [facility] hospital lab), Hosp Order     Status: None   Collection Time: 01/21/19  8:05 PM  Result Value Ref Range Status   SARS Coronavirus 2 NEGATIVE NEGATIVE Final    Comment: (NOTE) If result is NEGATIVE SARS-CoV-2 target nucleic acids are NOT DETECTED. The SARS-CoV-2 RNA is generally detectable in upper and lower  respiratory specimens during the acute phase of infection. The lowest  concentration of SARS-CoV-2 viral copies this assay can detect is 250  copies / mL. A negative result does not preclude SARS-CoV-2 infection  and should not be used as the sole basis for treatment or other  patient management decisions.  A negative result may occur with  improper specimen collection / handling, submission of specimen other  than nasopharyngeal swab, presence of viral mutation(s)  within the  areas targeted by this assay, and inadequate number of viral copies  (<250 copies / mL). A negative result must be combined with clinical  observations, patient history, and epidemiological information. If result is POSITIVE SARS-CoV-2 target nucleic acids are DETECTED. The SARS-CoV-2 RNA is generally detectable in upper and lower  respiratory specimens dur ing the acute phase of infection.  Positive  results are indicative of active infection with SARS-CoV-2.  Clinical  correlation with patient history and other diagnostic information is  necessary to determine patient infection status.  Positive results do  not rule out bacterial infection or co-infection with other viruses. If result is PRESUMPTIVE POSTIVE SARS-CoV-2 nucleic acids MAY BE PRESENT.   A presumptive positive result was obtained on the submitted specimen  and confirmed on repeat testing.  While 2019 novel coronavirus  (SARS-CoV-2) nucleic acids may be present in the submitted sample  additional confirmatory testing may be necessary for epidemiological  and / or clinical management purposes  to differentiate between  SARS-CoV-2 and other Sarbecovirus currently known to infect humans.  If clinically indicated additional testing with an alternate test  methodology [phone]) is advised. The SARS-CoV-2 RNA is generally  detectable in upper and lower respiratory sp ecimens during the acute  phase of infection. The expected result is Negative. Fact Sheet for Patients:  BoilerBrush.com.cy Fact Sheet for Healthcare Providers: https://pope.com/ This test is not yet approved or cleared by the [place] FDA and has been authorized for detection and/or diagnosis of SARS-CoV-2 by FDA under an Emergency Use Authorization (EUA).  This EUA will remain in effect (meaning this test can be used) for the duration of the COVID-19 declaration under Section 564(b)(1) of the Act,  21 U.S.C. section 360bbb-3(b)(1), unless the authorization is terminated or revoked sooner. Performed at [facility], [address], [place], [place] [address]          Radiology Studies: Dg Chest 2 View  Result Date: 01/21/2019 CLINICAL DATA:  Right upper quadrant pain EXAM: CHEST - 2 VIEW COMPARISON:  None. FINDINGS: Heart is mildly enlarged. Lungs are clear. No effusions or edema. No acute bony abnormality. IMPRESSION: Mild cardiomegaly.  No active disease. Electronically Signed   By: Charlett Nose M.D.   On: 01/21/2019 19:34   Ct Abdomen Pelvis W Contrast  Result Date: 01/21/2019 CLINICAL DATA:  Abdominal pain and weakness  EXAM: CT ABDOMEN AND PELVIS WITH CONTRAST TECHNIQUE: Multidetector CT imaging of the abdomen and pelvis was performed using the standard protocol following bolus administration of intravenous contrast. CONTRAST:  100mL OMNIPAQUE IOHEXOL 300 MG/ML  SOLN COMPARISON:  08/18/2018 FINDINGS: Lower chest: No acute abnormality. Hepatobiliary: No focal liver abnormality is seen. Low attenuation of the liver as can be seen with hepatic steatosis. No gallstones, gallbladder wall thickening, or biliary dilatation. Pancreas: Peripancreatic inflammatory changes around the pancreatic head. Pancreas enhances normally and homogeneously without areas of necrosis. Spleen: Normal in size without focal abnormality. Adrenals/Urinary Tract: Normal adrenal glands. 6.5 cm hypodense, fluid attenuating left renal mass. Punctate nonobstructing left renal calculi. No obstructive uropathy. Normal bladder. Stomach/Bowel: Stomach is within normal limits. Appendix appears normal. No bowel dilatation to suggest bowel obstruction. Inflammatory changes around the pancreatic head and along the second and third portions of the duodenum. Vascular/Lymphatic: No significant vascular findings are present. No enlarged abdominal or pelvic lymph nodes. Reproductive: Prostate is unremarkable. Other: No  abdominal wall hernia or abnormality. No abdominopelvic ascites. Musculoskeletal: No acute or significant osseous findings. IMPRESSION: 1. Inflammatory changes around the pancreatic head and second and third portions of the duodenum. Differential considerations include mild acute pancreatitis versus duodenitis. Electronically Signed   By: Elige KoHetal  Patel   On: 01/21/2019 21:58        Scheduled Meds:   HYDROmorphone (DILAUDID) injection  1 mg Intravenous Once    HYDROmorphone (DILAUDID) injection  1 mg Intravenous Once   morphine   Intravenous Q4H   pantoprazole (PROTONIX) IV  40 mg Intravenous Q12H   sucralfate  1 g Oral Q6H   Continuous Infusions:  dextrose     insulin       LOS: 0 days    Time spent: 35  mins.More than 50% of that time was spent in counseling and/or coordination of care.      Burnadette PopAmrit Elianis Fischbach, MD [facility] Pager [phone]  If 7PM-7AM, please contact night-coverage [email or web address] Password [email or web address] 01/22/2019, 1:48 PM

## 2019-01-23 LAB — GLUCOSE, CAPILLARY
Glucose-Capillary: 61 mg/dL — ABNORMAL LOW (ref 70–99)
Glucose-Capillary: 71 mg/dL (ref 70–99)
Glucose-Capillary: 64 mg/dL — ABNORMAL LOW (ref 70–99)
Glucose-Capillary: 69 mg/dL — ABNORMAL LOW (ref 70–99)
Glucose-Capillary: 64 mg/dL — ABNORMAL LOW (ref 70–99)
Glucose-Capillary: 80 mg/dL (ref 70–99)
Glucose-Capillary: 108 mg/dL — ABNORMAL HIGH (ref 70–99)
Glucose-Capillary: 77 mg/dL (ref 70–99)
Glucose-Capillary: 67 mg/dL — ABNORMAL LOW (ref 70–99)
Glucose-Capillary: 81 mg/dL (ref 70–99)
Glucose-Capillary: 137 mg/dL — ABNORMAL HIGH (ref 70–99)

## 2019-01-23 LAB — TRIGLYCERIDES
Triglycerides: 1280 mg/dL — ABNORMAL HIGH (ref <150)
Triglycerides: 1364 mg/dL — ABNORMAL HIGH (ref <150)

## 2019-01-23 LAB — BASIC METABOLIC PANEL
Anion gap: 10 (ref 5–15)
BUN: 7 mg/dL (ref 6–20)
CO2: 25 mmol/L (ref 22–32)
Calcium: 8.5 mg/dL — ABNORMAL LOW (ref 8.9–10.3)
Chloride: 99 mmol/L (ref 98–111)
Creatinine, Ser: 0.72 mg/dL (ref 0.61–1.24)
GFR calc Af Amer: >60 mL/min (ref >60)
GFR calc non Af Amer: >60 mL/min (ref >60)
Glucose, Bld: 69 mg/dL — ABNORMAL LOW (ref 70–99)
Potassium: 2.8 mmol/L — ABNORMAL LOW (ref 3.5–5.1)
Sodium: 134 mmol/L — ABNORMAL LOW (ref 135–145)

## 2019-01-23 LAB — MAGNESIUM: Magnesium: 2.1 mg/dL (ref 1.7–2.4)

## 2019-01-23 LAB — POTASSIUM: Potassium: 3.2 mmol/L — ABNORMAL LOW (ref 3.5–5.1)

## 2019-01-23 MED ORDER — POTASSIUM CHLORIDE 10 MEQ/100ML IV SOLN
10.0000 meq | INTRAVENOUS | Status: AC
  Administered 2019-01-23 – 2019-01-24 (×4): 10 meq via INTRAVENOUS
  Filled 2019-01-23 (×4): qty 100

## 2019-01-23 MED ORDER — POTASSIUM CHLORIDE 10 MEQ/100ML IV SOLN
10.0000 meq | INTRAVENOUS | Status: AC
  Administered 2019-01-23 (×5): 10 meq via INTRAVENOUS
  Filled 2019-01-23 (×5): qty 100

## 2019-01-23 MED ORDER — ORAL CARE MOUTH RINSE
15.0000 mL | Freq: Two times a day (BID) | OROMUCOSAL | Status: DC
  Administered 2019-01-23 – 2019-01-24 (×4): 15 mL via OROMUCOSAL

## 2019-01-23 MED ORDER — POTASSIUM CHLORIDE 10 MEQ/100ML IV SOLN: 10.0000 meq | INTRAVENOUS | Status: DC

## 2019-01-23 MED ORDER — CHLORHEXIDINE GLUCONATE CLOTH 2 % EX PADS
6.0000 | MEDICATED_PAD | Freq: Every day | CUTANEOUS | Status: DC
  Administered 2019-01-23 – 2019-01-24 (×2): 6 via TOPICAL

## 2019-01-23 NOTE — Progress Notes (Signed)
Paged Schorr, NP via amion and made NP aware of K+ 2.8 and that CBG is 61.  NP placed orders for IV K+ replacement and to increase D 10 IVF rate.

## 2019-01-23 NOTE — Progress Notes (Signed)
Subjective: No complaints.  Feeling better.  Pain is much better controlled.  Objective: Vital signs in last 24 hours: Temp:  [98.1 F (36.7 C)-99.8 F (37.7 C)] 99.3 F (37.4 C) (05/07 0350) Pulse Rate:  [67-88] 71 (05/07 0600) Resp:  [14-26] 26 (05/07 0834) BP: (117-139)/(63-81) 117/73 (05/07 0600) SpO2:  [90 %-99 %] 93 % (05/07 0834) Weight:  [119.6 kg] 119.6 kg (05/06 1500) Last BM Date: 01/20/19  Intake/Output from previous day: 05/06 0701 - 05/07 0700 In: 2485.2 [I.V.:2258.4; IV Piggyback:226.8] Out: -  Intake/Output this shift: No intake/output data recorded.  General appearance: alert and no distress GI: mild upper abdominal tenderness  Lab Results: Recent Labs    01/21/19 1929 01/22/19 0415  WBC 10.7* 9.6  HGB 14.4 12.2*  HCT 39.9 35.8*  PLT 184 141*   BMET Recent Labs    01/21/19 1928 01/22/19 0415 01/23/19 0307  NA 128* 131* 134*  K 3.5 3.5 2.8*  CL 94* 98 99  CO2 24 24 25   GLUCOSE 168* 160* 69*  BUN 9 7 7   CREATININE 0.84 0.72 0.72  CALCIUM 9.1 8.3* 8.5*   LFT Recent Labs    01/22/19 0415  PROT 6.0*  ALBUMIN 3.3*  AST 13*  ALT 17  ALKPHOS 46  BILITOT 0.8   PT/INR Recent Labs    01/21/19 1929  LABPROT 12.6  INR 1.0   Hepatitis Panel No results for input(s): HEPBSAG, HCVAB, HEPAIGM, HEPBIGM in the last 72 hours. C-Diff No results for input(s): CDIFFTOX in the last 72 hours. Fecal Lactopherrin No results for input(s): FECLLACTOFRN in the last 72 hours.  Studies/Results: Dg Chest 2 View  Result Date: 01/21/2019 CLINICAL DATA:  Right upper quadrant pain EXAM: CHEST - 2 VIEW COMPARISON:  None. FINDINGS: Heart is mildly enlarged. Lungs are clear. No effusions or edema. No acute bony abnormality. IMPRESSION: Mild cardiomegaly.  No active disease. Electronically Signed   By: Charlett Nose M.Silva.   On: 01/21/2019 19:34   Ct Abdomen Pelvis W Contrast  Result Date: 01/21/2019 CLINICAL DATA:  Abdominal pain and weakness EXAM: CT ABDOMEN AND  PELVIS WITH CONTRAST TECHNIQUE: Multidetector CT imaging of the abdomen and pelvis was performed using the standard protocol following bolus administration of intravenous contrast. CONTRAST:  OMNIPAQUE IOHEXOL 300 MG/ML  SOLN COMPARISON:  08/18/2018 FINDINGS: Lower chest: No acute abnormality. Hepatobiliary: No focal liver abnormality is seen. Low attenuation of the liver as can be seen with hepatic steatosis. No gallstones, gallbladder wall thickening, or biliary dilatation. Pancreas: Peripancreatic inflammatory changes around the pancreatic head. Pancreas enhances normally and homogeneously without areas of necrosis. Spleen: Normal in size without focal abnormality. Adrenals/Urinary Tract: Normal adrenal glands. 6.5 cm hypodense, fluid attenuating left renal mass. Punctate nonobstructing left renal calculi. No obstructive uropathy. Normal bladder. Stomach/Bowel: Stomach is within normal limits. Appendix appears normal. No bowel dilatation to suggest bowel obstruction. Inflammatory changes around the pancreatic head and along the second and third portions of the duodenum. Vascular/Lymphatic: No significant vascular findings are present. No enlarged abdominal or pelvic lymph nodes. Reproductive: Prostate is unremarkable. Other: No abdominal wall hernia or abnormality. No abdominopelvic ascites. Musculoskeletal: No acute or significant osseous findings. IMPRESSION: 1. Inflammatory changes around the pancreatic head and second and third portions of the duodenum. Differential considerations include mild acute pancreatitis versus duodenitis. Electronically Signed   By: Elige Ko   On: 01/21/2019 21:58    Medications:  Scheduled: . Chlorhexidine Gluconate Cloth  6 each Topical Daily  . mouth  rinse  15 mL Mouth Rinse BID  . morphine   Intravenous Q4H  . pantoprazole (PROTONIX) IV  40 mg Intravenous Q12H  . sucralfate  1 g Oral Q6H   Continuous: . dextrose 125 mL/hr at 01/23/19 0833  . insulin 0.05  Units/kg/hr (01/23/19 0700)  . potassium chloride Stopped (01/23/19 [id number])    Assessment/Plan: 1) Acute pancreatitis secondary to hypertriglyceridemia. 2) Hypertriglyceridemia.   Clinically the patient continues to improve.  His TG is still elevated.  I am appreciative of Dr. Dionisio DavidAdhikari's management of this issue.  His pain is improved with the PCA and he reports that his use is declining.  Plan: 1) Continue with insulin drip per Dr. Renford DillsAdhikari. 2) Continue NPO for now. 3) Continue with the PCA.  LOS: 1 day   Mark Silva 01/23/2019, 8:36 AM

## 2019-01-23 NOTE — Progress Notes (Addendum)
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  He reported associated nausea and vomiting.  CT abdomen/pelvis revealed inflammatory changes around the pancreatic head and second/third portion of the duodenum concerning for mild acute pancreatitis versus duodenitis.  GI consulted.  Lipid panel showed elevated triglyceride level today.  Patient started on insulin drip and moved to ICU.   Assessment & Plan:   Principal Problem:   Acute duodenitis Active Problems:   Chronic hyponatremia   SIRS (systemic inflammatory response syndrome) (HCC)  Acute pancreatitis: CT finding as above.  Most likely etiology is elevated triglyceride level.  Patient does not drink alcohol. Patient has been started on PCA pump for abdominal pain.  GI following.   He was started on insulin drip for hypertriglyceridemia.  Will check triglyceride level every 12 hours.  Improving.Started on D10  to prevent hypoglycemia.  Check CBG every 1 hours. Abdominal pain has significantly improved today.  No nausea or vomiting.  Will start on clear liquid diet today to prevent persistent hypoglycemia.  Abdominal pain: Does not complain of any abdominal pain today.  Abdomen significantly less tender.  Also on IV Protonix and Carafate.    History of diverticulitis: Status post sigmoid colectomy in 2013.  Hyponatremia: Mild.    Hypokalemia: Being supplemented.       DVT prophylaxis:SCD Code Status: Full Family Communication: None present at the bedside Disposition Plan: Home after resolution of abdominal pain, improvement in triglyceride level.   Consultants: GI  Procedures: None  Antimicrobials:    Anti-infectives (From admission, onward)   Start     Dose/Rate Route Frequency Ordered Stop   01/21/19 2000  ceFEPIme (MAXIPIME) 2 g in sodium chloride 0.9 % 100 mL IVPB     2 g 200 mL/hr over 30 Minutes Intravenous  Once 01/21/19 1952 01/21/19 2232   01/21/19 2000  metroNIDAZOLE (FLAGYL) IVPB 500 mg     500 mg 100 mL/hr over 60 Minutes Intravenous  Once 01/21/19 1952 01/21/19 2341      Subjective:  Patient seen and examined at bedside this morning.  He appears very comfortable.  Hemodynamically stable.  Had episodes of hypoglycemia last night.  Does not complain of any abdominal pain today so will be started on clear liquid diet.  Objective: Vitals:   01/23/19 0600 01/23/19 0800 01/23/19 0834 01/23/19 1115  BP: 117/73     Pulse: 71     Resp: 15  (!) 26 19  Temp:  98.3 F (36.8 C)    TempSrc:  Oral    SpO2: 90%  93% 98%  Weight:      Height:        Intake/Output Summary (Last 24 hours) at 01/23/2019 1228 Last data filed at 01/23/2019 0700 Gross per 24 hour  Intake 2109.2 ml  Output --  Net 2109.2 ml   Filed Weights   01/21/19 1917 01/22/19 1500  Weight: 122.5 kg 119.6 kg    Examination:  General exam: Appears calm and comfortable ,Not in distress,average built HEENT:PERRL,Oral mucosa moist, Ear/Nose normal on gross exam Respiratory system: Bilateral equal air entry, normal vesicular breath sounds, no wheezes or crackles  Cardiovascular system: S1 & S2 heard, RRR. No JVD, murmurs, rubs, gallops or clicks. Gastrointestinal  system: Abdomen is nondistended, soft and nontender. No organomegaly or masses felt. Normal bowel sounds heard. Central nervous system: Alert and oriented. No focal neurological deficits. Extremities: No edema, no clubbing ,no cyanosis, distal peripheral pulses palpable. Skin: No rashes, lesions or ulcers,no icterus ,no pallor  Data Reviewed: I have personally reviewed following labs and imaging studies  CBC: Recent Labs  Lab 01/21/19 1929  01/22/19 0415  WBC 10.7* 9.6  NEUTROABS 8.4* 7.4  HGB 14.4 12.2*  HCT 39.9 35.8*  MCV 85.6 88.2  PLT 184 141*   Basic Metabolic Panel: Recent Labs  Lab 01/21/19 1928 01/22/19 0415 01/23/19 0307  NA 128* 131* 134*  K 3.5 3.5 2.8*  CL 94* 98 99  CO2 24 24 25   GLUCOSE 168* 160* 69*  BUN 9 7 7   CREATININE 0.84 0.72 0.72  CALCIUM 9.1 8.3* 8.5*  MG  --   --  2.1   GFR: Estimated Creatinine Clearance: 155.7 mL/min (by C-G formula based on SCr of 0.72 mg/dL). Liver Function Tests: Recent Labs  Lab 01/21/19 1928 01/22/19 0415  AST 19 13*  ALT 21 17  ALKPHOS 55 46  BILITOT 1.2 0.8  PROT 6.9 6.0*  ALBUMIN 3.9 3.3*   Recent Labs  Lab 01/21/19 1928 01/22/19 0415  LIPASE 46 35   No results for input(s): AMMONIA in the last 168 hours. Coagulation Profile: Recent Labs  Lab 01/21/19 1929  INR 1.0   Cardiac Enzymes: No results for input(s): CKTOTAL, CKMB, CKMBINDEX, TROPONINI in the last 168 hours. BNP (last 3 results) No results for input(s): PROBNP in the last 8760 hours. HbA1C: No results for input(s): HGBA1C in the last 72 hours. CBG: Recent Labs  Lab 01/23/19 0656 01/23/19 0723 01/23/19 0815 01/23/19 0922 01/23/19 1215  GLUCAP 69* 64* 64* 80 108*   Lipid Profile: Recent Labs    01/22/19 0415 01/22/19 1932 01/23/19 0307  CHOL 432*  --   --   HDL NOT REPORTED DUE TO HIGH TRIGLYCERIDES  --   --   LDLCALC UNABLE TO CALCULATE IF TRIGLYCERIDE OVER 400 mg/dL  --   --   TRIG 5,681* 1,458* 1,280*  CHOLHDL NOT REPORTED DUE TO HIGH TRIGLYCERIDES  --   --   LDLDIRECT NOT CALCULATED  --   --    Thyroid Function Tests: No results for input(s): TSH, T4TOTAL, FREET4, T3FREE, THYROIDAB in the last 72 hours. Anemia Panel: No results for input(s): VITAMINB12, FOLATE, FERRITIN, TIBC, IRON, RETICCTPCT in the last 72 hours. Sepsis Labs: Recent Labs  Lab 01/21/19 1928 01/21/19 2000  LATICACIDVEN 1.1 1.1    Recent Results (from the past 240 hour(s))  Culture,  blood (Routine x 2)     Status: None (Preliminary result)   Collection Time: 01/21/19  7:19 PM  Result Value Ref Range Status   Specimen Description   Final    BLOOD LEFT ANTECUBITAL Performed at [facility], [address], [place], [place] [address]    Special Requests   Final    BOTTLES DRAWN AEROBIC AND ANAEROBIC Blood Culture results may not be optimal due to an excessive volume of blood received in culture bottles Performed at [facility], [address], [place], [place] [address]    Culture   Final    NO GROWTH 2 DAYS Performed at [facility], [address], [place], [place] [address]    Report Status PENDING  Incomplete  Culture, blood (Routine x 2)  Status: None (Preliminary result)   Collection Time: 01/21/19  8:04 PM  Result Value Ref Range Status   Specimen Description   Final    BLOOD RIGHT ARM Performed at [facility], [address], [place], [place] [address]    Special Requests   Final    BOTTLES DRAWN AEROBIC AND ANAEROBIC Blood Culture adequate volume Performed at [facility], [address] [place], [place] [address]    Culture   Final    NO GROWTH 2 DAYS Performed at [facility], [address], [place], [place] [address]    Report Status PENDING  Incomplete  SARS Coronavirus 2 (CEPHEID - Performed in [facility] hospital lab), Hosp Order     Status: None   Collection Time: 01/21/19  8:05 PM  Result Value Ref Range Status   SARS Coronavirus 2 NEGATIVE NEGATIVE Final    Comment: (NOTE) If result is NEGATIVE SARS-CoV-2 target nucleic acids are NOT DETECTED. The SARS-CoV-2 RNA is generally detectable in upper and lower  respiratory specimens during the acute phase of infection. The lowest  concentration of SARS-CoV-2 viral copies this assay can detect is 250  copies / mL. A negative result does not preclude SARS-CoV-2 infection  and should not be used as the  sole basis for treatment or other  patient management decisions.  A negative result may occur with  improper specimen collection / handling, submission of specimen other  than nasopharyngeal swab, presence of viral mutation(s) within the  areas targeted by this assay, and inadequate number of viral copies  (<250 copies / mL). A negative result must be combined with clinical  observations, patient history, and epidemiological information. If result is POSITIVE SARS-CoV-2 target nucleic acids are DETECTED. The SARS-CoV-2 RNA is generally detectable in upper and lower  respiratory specimens dur ing the acute phase of infection.  Positive  results are indicative of active infection with SARS-CoV-2.  Clinical  correlation with patient history and other diagnostic information is  necessary to determine patient infection status.  Positive results do  not rule out bacterial infection or co-infection with other viruses. If result is PRESUMPTIVE POSTIVE SARS-CoV-2 nucleic acids MAY BE PRESENT.   A presumptive positive result was obtained on the submitted specimen  and confirmed on repeat testing.  While 2019 novel coronavirus  (SARS-CoV-2) nucleic acids may be present in the submitted sample  additional confirmatory testing may be necessary for epidemiological  and / or clinical management purposes  to differentiate between  SARS-CoV-2 and other Sarbecovirus currently known to infect humans.  If clinically indicated additional testing with an alternate test  methodology [phone]) is advised. The SARS-CoV-2 RNA is generally  detectable in upper and lower respiratory sp ecimens during the acute  phase of infection. The expected result is Negative. Fact Sheet for Patients:  BoilerBrush.com.cy Fact Sheet for Healthcare Providers: https://pope.com/ This test is not yet approved or cleared by the [place] FDA and has been authorized for detection  and/or diagnosis of SARS-CoV-2 by FDA under an Emergency Use Authorization (EUA).  This EUA will remain in effect (meaning this test can be used) for the duration of the COVID-19 declaration under Section 564(b)(1) of the Act, 21 U.S.C. section 360bbb-3(b)(1), unless the authorization is terminated or revoked sooner. Performed at [facility], [address] [place], [place] [address]   MRSA PCR Screening     Status: None   Collection Time: 01/22/19  2:47 PM  Result  Value Ref Range Status   MRSA by PCR NEGATIVE NEGATIVE Final    Comment:        The GeneXpert MRSA Assay (FDA approved for NASAL specimens only), is one component of a comprehensive MRSA colonization surveillance program. It is not intended to diagnose MRSA infection nor to guide or monitor treatment for MRSA infections. Performed at [facility], [address] [place], [place] [address]          Radiology Studies: Dg Chest 2 View  Result Date: 01/21/2019 CLINICAL DATA:  Right upper quadrant pain EXAM: CHEST - 2 VIEW COMPARISON:  None. FINDINGS: Heart is mildly enlarged. Lungs are clear. No effusions or edema. No acute bony abnormality. IMPRESSION: Mild cardiomegaly.  No active disease. Electronically Signed   By: Charlett Nose M.D.   On: 01/21/2019 19:34   Ct Abdomen Pelvis W Contrast  Result Date: 01/21/2019 CLINICAL DATA:  Abdominal pain and weakness EXAM: CT ABDOMEN AND PELVIS WITH CONTRAST TECHNIQUE: Multidetector CT imaging of the abdomen and pelvis was performed using the standard protocol following bolus administration of intravenous contrast. CONTRAST:  OMNIPAQUE IOHEXOL 300 MG/ML  SOLN COMPARISON:  08/18/2018 FINDINGS: Lower chest: No acute abnormality. Hepatobiliary: No focal liver abnormality is seen. Low attenuation of the liver as can be seen with hepatic steatosis. No gallstones, gallbladder wall thickening, or biliary dilatation. Pancreas: Peripancreatic  inflammatory changes around the pancreatic head. Pancreas enhances normally and homogeneously without areas of necrosis. Spleen: Normal in size without focal abnormality. Adrenals/Urinary Tract: Normal adrenal glands. 6.5 cm hypodense, fluid attenuating left renal mass. Punctate nonobstructing left renal calculi. No obstructive uropathy. Normal bladder. Stomach/Bowel: Stomach is within normal limits. Appendix appears normal. No bowel dilatation to suggest bowel obstruction. Inflammatory changes around the pancreatic head and along the second and third portions of the duodenum. Vascular/Lymphatic: No significant vascular findings are present. No enlarged abdominal or pelvic lymph nodes. Reproductive: Prostate is unremarkable. Other: No abdominal wall hernia or abnormality. No abdominopelvic ascites. Musculoskeletal: No acute or significant osseous findings. IMPRESSION: 1. Inflammatory changes around the pancreatic head and second and third portions of the duodenum. Differential considerations include mild acute pancreatitis versus duodenitis. Electronically Signed   By: Elige Ko   On: 01/21/2019 21:58        Scheduled Meds:  Chlorhexidine Gluconate Cloth  6 each Topical Daily   mouth rinse  15 mL Mouth Rinse BID   morphine   Intravenous Q4H   pantoprazole (PROTONIX) IV  40 mg Intravenous Q12H   sucralfate  1 g Oral Q6H   Continuous Infusions:  dextrose 125 mL/hr at 01/23/19 0833   insulin 0.05 Units/kg/hr (01/23/19 0700)     LOS: 1 day    Time spent: 35  mins.More than 50% of that time was spent in counseling and/or coordination of care.      Burnadette Pop, MD [facility] Pager [phone]  If 7PM-7AM, please contact night-coverage [email or web address] Password [email or web address] 01/23/2019, 12:28 PM

## 2019-01-23 NOTE — Progress Notes (Signed)
Hypoglycemic Event  CBG: 67  Treatment: 4 oz juice/soda  Symptoms: None  Follow-up CBG: Time:1620 CBG Result:  Possible Reasons for Event: Medication regimen: Insulin gtt  Comments/MD notified:    Darryl Willner D

## 2019-01-23 NOTE — Progress Notes (Deleted)
Paged Schorr, NP via Amion and made her aware of CBG 43, patient alert and talking.  Made NP aware that patient can't tolerate rapid intake of juice or food due to it causing nausea. 

## 2019-01-23 NOTE — Progress Notes (Signed)
2 mL of morphine from PCA syringe wasted with Lannie Fields, RN.  Syringe was changed out.

## 2019-01-24 LAB — BASIC METABOLIC PANEL
Anion gap: 12 (ref 5–15)
BUN: 5 mg/dL — ABNORMAL LOW (ref 6–20)
CO2: 23 mmol/L (ref 22–32)
Calcium: 8.7 mg/dL — ABNORMAL LOW (ref 8.9–10.3)
Chloride: 100 mmol/L (ref 98–111)
Creatinine, Ser: 0.76 mg/dL (ref 0.61–1.24)
GFR calc Af Amer: >60 mL/min (ref >60)
GFR calc non Af Amer: >60 mL/min (ref >60)
Glucose, Bld: 90 mg/dL (ref 70–99)
Potassium: 3.4 mmol/L — ABNORMAL LOW (ref 3.5–5.1)
Sodium: 135 mmol/L (ref 135–145)

## 2019-01-24 LAB — GLUCOSE, CAPILLARY
Glucose-Capillary: 124 mg/dL — ABNORMAL HIGH (ref 70–99)
Glucose-Capillary: 168 mg/dL — ABNORMAL HIGH (ref 70–99)
Glucose-Capillary: 172 mg/dL — ABNORMAL HIGH (ref 70–99)
Glucose-Capillary: 178 mg/dL — ABNORMAL HIGH (ref 70–99)
Glucose-Capillary: 184 mg/dL — ABNORMAL HIGH (ref 70–99)
Glucose-Capillary: 325 mg/dL — ABNORMAL HIGH (ref 70–99)
Glucose-Capillary: 46 mg/dL — ABNORMAL LOW (ref 70–99)
Glucose-Capillary: 52 mg/dL — ABNORMAL LOW (ref 70–99)
Glucose-Capillary: 57 mg/dL — ABNORMAL LOW (ref 70–99)
Glucose-Capillary: 86 mg/dL (ref 70–99)

## 2019-01-24 LAB — TRIGLYCERIDES: Triglycerides: 1150 mg/dL — ABNORMAL HIGH (ref <150)

## 2019-01-24 MED ORDER — DEXTROSE 50 % IV SOLN
1.0000 | Freq: Once | INTRAVENOUS | Status: AC
  Administered 2019-01-24: 05:00:00 50 mL via INTRAVENOUS
  Filled 2019-01-24: qty 50

## 2019-01-24 MED ORDER — SODIUM CHLORIDE 0.9 % IV SOLN
INTRAVENOUS | Status: DC
  Administered 2019-01-24 (×2): via INTRAVENOUS

## 2019-01-24 MED ORDER — INSULIN ASPART 100 UNIT/ML ~~LOC~~ SOLN
0.0000 [IU] | Freq: Three times a day (TID) | SUBCUTANEOUS | Status: DC
  Administered 2019-01-24: 2 [IU] via SUBCUTANEOUS
  Administered 2019-01-24: 3 [IU] via SUBCUTANEOUS

## 2019-01-24 MED ORDER — POTASSIUM CHLORIDE 10 MEQ/100ML IV SOLN
10.0000 meq | INTRAVENOUS | Status: AC
  Administered 2019-01-24 (×3): 10 meq via INTRAVENOUS
  Filled 2019-01-24 (×3): qty 100

## 2019-01-24 MED ORDER — INSULIN ASPART 100 UNIT/ML ~~LOC~~ SOLN: 0.0000 [IU] | Freq: Every day | SUBCUTANEOUS | Status: DC

## 2019-01-24 MED ORDER — DEXTROSE-NACL 5-0.9 % IV SOLN
INTRAVENOUS | Status: DC
  Administered 2019-01-24: 08:00:00 via INTRAVENOUS

## 2019-01-24 MED ORDER — OXYCODONE-ACETAMINOPHEN 7.5-325 MG PO TABS
1.0000 | ORAL_TABLET | Freq: Four times a day (QID) | ORAL | Status: DC | PRN
  Administered 2019-01-24 (×2): 1 via ORAL
  Filled 2019-01-24 (×2): qty 1

## 2019-01-24 NOTE — Progress Notes (Signed)
Blood glucose 52, gave patient 8 oz of juice.  Patient denies hypoglycemia symptoms and is A&Ox.

## 2019-01-24 NOTE — Progress Notes (Addendum)
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  He reported associated nausea and vomiting.  CT abdomen/pelvis revealed inflammatory changes around the pancreatic head and second/third portion of the duodenum concerning for mild acute pancreatitis versus duodenitis.  GI consulted.  Lipid panel showed elevated triglyceride level .  Patient started on insulin drip and moved to ICU.  Provement in the triglyceride level but due to persistent hypoglycemia, will discontinue insulin drip.  Patient's abdominal pain has significantly improved since yesterday.  Started on clear liquid diet.   Assessment & Plan:   Principal Problem:   Acute duodenitis Active Problems:   Chronic hyponatremia   SIRS (systemic inflammatory response syndrome) (HCC)  Acute pancreatitis: CT finding as above.  Most likely etiology is elevated triglyceride level.  Patient does not drink alcohol. Patient was started on PCA pump for abdominal pain.  GI following.   He was started on insulin drip for hypertriglyceridemia with improvement but surprisingly it did not fall below 1000.  Abdominal pain has significantly improved today.  No nausea or vomiting.  Will start on clear liquid diet today to prevent persistent hypoglycemia.  Discontinue insulin drip and transfer the patient to the regular floor.  Hypertriglyceridemia: We will start him on fenofibrate.  He should check his lipid panel with triglyceride again in 3 months.  Discussed the importance of healthy diet and regular exercise.  Abdominal pain: Does not complain of any abdominal pain today.  Abdomen significantly less tender.  Also on IV Protonix and  Carafate.    History of diverticulitis: Status post sigmoid colectomy in 2013.  Hyponatremia: Mild.    Hypokalemia: Being supplemented.       DVT prophylaxis:SCD Code Status: Full Family Communication: None present at the bedside Disposition Plan: Home after GI clearance  Consultants: GI  Procedures: None  Antimicrobials:  Anti-infectives (From admission, onward)   Start     Dose/Rate Route Frequency Ordered Stop   01/21/19 2000  ceFEPIme (MAXIPIME) 2 g in sodium chloride 0.9 % 100 mL IVPB     2 g 200 mL/hr over 30 Minutes Intravenous  Once 01/21/19 1952 01/21/19 2232   01/21/19 2000  metroNIDAZOLE (FLAGYL) IVPB 500 mg     500 mg 100 mL/hr over 60 Minutes Intravenous  Once 01/21/19 1952 01/21/19 2341      Subjective:  Patient seen and examined at bedside this morning.  Hemodynamically stable.  Remains comfortable.  Had hypoglycemic episodes last night.  Denies any abdominal pain, nausea or vomiting. Objective: Vitals:   01/24/19 0600 01/24/19 0735 01/24/19 0800 01/24/19 0802  BP: 119/72  114/66   Pulse: 70  67   Resp: 13  16 12   Temp:   97.8 F (36.6 C)   TempSrc:   Oral   SpO2: 98% 98% 91% 98%  Weight:      Height:        Intake/Output Summary (Last 24 hours) at 01/24/2019 1114 Last data filed at 01/24/2019 1015 Gross per 24 hour  Intake 3951.92 ml  Output -  Net 3951.92 ml   Filed Weights   01/21/19 1917 01/22/19 1500  Weight: 122.5 kg 119.6 kg    Examination:  General exam: Appears calm and comfortable ,Not in distress,obese  HEENT:PERRL,Oral mucosa moist, Ear/Nose normal on gross exam Respiratory system: Bilateral equal air entry, normal vesicular breath sounds, no wheezes or crackles  Cardiovascular system: S1 & S2 heard, RRR. No JVD, murmurs, rubs, gallops or clicks. Gastrointestinal system: Abdomen is nondistended, soft and nontender. No organomegaly or masses felt. Normal bowel sounds heard. Central nervous system: Alert and oriented. No focal  neurological deficits. Extremities: No edema, no clubbing ,no cyanosis, distal peripheral pulses palpable. Skin: No rashes, lesions or ulcers,no icterus ,no pallor MSK: Normal muscle bulk,tone ,power Psychiatry: Judgement and insight appear normal. Mood & affect appropriate.     Data Reviewed: I have personally reviewed following labs and imaging studies  CBC: Recent Labs  Lab 01/21/19 1929 01/22/19 0415  WBC 10.7* 9.6  NEUTROABS 8.4* 7.4  HGB 14.4 12.2*  HCT 39.9 35.8*  MCV 85.6 88.2  PLT 184 141*   Basic Metabolic Panel: Recent Labs  Lab 01/21/19 1928 01/22/19 0415 01/23/19 0307 01/23/19 1745 01/24/19 0250  NA 128* 131* 134*  --  135  K 3.5 3.5 2.8* 3.2* 3.4*  CL 94* 98 99  --  100  CO2 --  23  GLUCOSE 168* 160* 69*  --  90  BUN --  5*  CREATININE 0.84 0.72 0.72  --  0.76  CALCIUM 9.1 8.3* 8.5*  --  8.7*  MG  --   --  2.1  --   --    GFR: Estimated Creatinine Clearance: 155.7 mL/min (by C-G formula based on SCr of 0.76 mg/dL). Liver Function Tests: Recent Labs  Lab 01/21/19 1928 01/22/19 0415  AST 19 13*  ALT 21 17  ALKPHOS 55 46  BILITOT 1.2 0.8  PROT 6.9 6.0*  ALBUMIN 3.9 3.3*   Recent Labs  Lab 01/21/19 1928 01/22/19 0415  LIPASE 46 35   No results for input(s): AMMONIA in the last 168 hours. Coagulation Profile: Recent Labs  Lab 01/21/19 1929  INR 1.0   Cardiac Enzymes: No results for input(s): CKTOTAL, CKMB, CKMBINDEX, TROPONINI in the last 168 hours. BNP (last 3 results) No results for input(s): PROBNP in the last 8760 hours. HbA1C: No results for input(s): HGBA1C in the last 72 hours. CBG: Recent Labs  Lab 01/24/19 0430 01/24/19 0510 01/24/19 0615 01/24/19 0722 01/24/19 0944  GLUCAP 52* 46* 184* 325* 178*   Lipid Profile: Recent Labs    01/22/19 0415  01/23/19 1745 01/24/19 0250  CHOL 432*  --   --   --   HDL NOT REPORTED DUE TO HIGH TRIGLYCERIDES  --   --   --   LDLCALC UNABLE TO CALCULATE IF  TRIGLYCERIDE OVER 400 mg/dL  --   --   --   TRIG 1,610*   < > 1,364* 1,150*  CHOLHDL NOT REPORTED DUE TO HIGH TRIGLYCERIDES  --   --   --   LDLDIRECT NOT CALCULATED  --   --   --    < > = values in this interval not displayed.   Thyroid Function Tests: No results for input(s): TSH, T4TOTAL, FREET4, T3FREE, THYROIDAB in the last 72 hours. Anemia Panel: No results for input(s): VITAMINB12, FOLATE, FERRITIN, TIBC, IRON, RETICCTPCT in the last 72 hours. Sepsis Labs: Recent Labs  Lab 01/21/19 1928 01/21/19 2000  LATICACIDVEN 1.1 1.1    Recent Results (from the past 240 hour(s))  Culture, blood (Routine x 2)     Status: None (Preliminary result)   Collection Time: 01/21/19  7:19 PM  Result Value Ref Range Status   Specimen Description   Final    BLOOD LEFT ANTECUBITAL Performed at [facility], [address] [place], [place] [address]    Special Requests   Final    BOTTLES DRAWN AEROBIC AND ANAEROBIC Blood Culture results may not be optimal due to an excessive volume of blood received in culture bottles Performed at [facility], [address] [place], [place] [address]    Culture   Final    NO GROWTH 3 DAYS Performed at [facility], [address] [place], [place] [address]    Report Status PENDING  Incomplete  Culture, blood (Routine x 2)     Status: None (Preliminary result)   Collection Time: 01/21/19  8:04 PM  Result Value Ref Range Status   Specimen Description   Final    BLOOD RIGHT ARM Performed at [facility], [address] [place], [place] [address]    Special Requests   Final    BOTTLES DRAWN AEROBIC AND ANAEROBIC Blood Culture adequate volume Performed at [facility], [address], [place], [place] [address]    Culture   Final    NO GROWTH 3 DAYS Performed at [facility], [address], [place], [place] [address]    Report Status PENDING  Incomplete  SARS  Coronavirus 2 (CEPHEID - Performed in [facility] hospital lab), Hosp Order     Status: None   Collection Time: 01/21/19  8:05 PM  Result Value Ref Range Status   SARS Coronavirus 2 NEGATIVE NEGATIVE Final    Comment: (NOTE) If result is NEGATIVE SARS-CoV-2 target nucleic acids are NOT DETECTED. The SARS-CoV-2 RNA is generally detectable in upper and lower  respiratory specimens during the acute phase of infection. The lowest  concentration of SARS-CoV-2 viral copies this assay can detect is 250  copies / mL. A negative result does not preclude SARS-CoV-2 infection  and should not be used as the sole basis for treatment or other  patient management decisions.  A negative result may occur with  improper specimen collection / handling, submission of specimen other  than nasopharyngeal swab, presence of viral mutation(s) within the  areas targeted by this assay, and inadequate number of viral copies  (<250 copies / mL). A negative result must be combined with clinical  observations, patient history, and epidemiological information. If result is POSITIVE SARS-CoV-2 target nucleic acids are DETECTED. The SARS-CoV-2 RNA is generally detectable in upper and lower  respiratory specimens dur ing the acute phase of infection.  Positive  results are indicative of active infection with SARS-CoV-2.  Clinical  correlation with patient history and other diagnostic information is  necessary to determine patient infection status.  Positive results do  not rule out bacterial infection or co-infection with other viruses. If result is PRESUMPTIVE POSTIVE SARS-CoV-2 nucleic acids MAY BE PRESENT.   A presumptive positive result was obtained on the submitted specimen  and confirmed on repeat testing.  While 2019 novel coronavirus  (SARS-CoV-2) nucleic acids may be present in the submitted sample  additional confirmatory testing may be necessary for epidemiological  and / or clinical management purposes  to  differentiate between  SARS-CoV-2 and other Sarbecovirus currently known to infect humans.  If clinically indicated additional testing with an alternate test  methodology [phone]) is advised. The SARS-CoV-2 RNA is generally  detectable in upper and lower respiratory sp ecimens during  the acute  phase of infection. The expected result is Negative. Fact Sheet for Patients:  BoilerBrush.com.cy Fact Sheet for Healthcare Providers: https://pope.com/ This test is not yet approved or cleared by the [place] FDA and has been authorized for detection and/or diagnosis of SARS-CoV-2 by FDA under an Emergency Use Authorization (EUA).  This EUA will remain in effect (meaning this test can be used) for the duration of the COVID-19 declaration under Section 564(b)(1) of the Act, 21 U.S.C. section 360bbb-3(b)(1), unless the authorization is terminated or revoked sooner. Performed at [facility], [address], [place], [place] [address]   MRSA PCR Screening     Status: None   Collection Time: 01/22/19  2:47 PM  Result Value Ref Range Status   MRSA by PCR NEGATIVE NEGATIVE Final    Comment:        The GeneXpert MRSA Assay (FDA approved for NASAL specimens only), is one component of a comprehensive MRSA colonization surveillance program. It is not intended to diagnose MRSA infection nor to guide or monitor treatment for MRSA infections. Performed at [facility], [address] [place], [place] [address]          Radiology Studies: No results found.      Scheduled Meds: . Chlorhexidine Gluconate Cloth  6 each Topical Daily  . insulin aspart  0-15 Units Subcutaneous TID WC  . insulin aspart  0-5 Units Subcutaneous QHS  . mouth rinse  15 mL Mouth Rinse BID  . morphine   Intravenous Q4H  . pantoprazole (PROTONIX) IV  40 mg Intravenous Q12H  . sucralfate  1 g Oral Q6H   Continuous  Infusions: . sodium chloride 75 mL/hr at 01/24/19 1015  . potassium chloride Stopped (01/24/19 1127)     LOS: 2 days    Time spent: 35  mins.More than 50% of that time was spent in counseling and/or coordination of care.      Burnadette Pop, MD [facility] Pager [phone]  If 7PM-7AM, please contact night-coverage [email or web address] Password [email or web address] 01/24/2019, 11:14 AM

## 2019-01-24 NOTE — Progress Notes (Signed)
Blood glucose 46, pt sweaty.  Held insulin drip.  D10 @125cc /H infusing.  Drinking juice and eating popsicle.  Paged Bruna Potter, NP via Amion with all info listed above.

## 2019-01-24 NOTE — Progress Notes (Signed)
Subjective: No complaints.  He is feeling well.  He self-discontinued his PCA.  Objective: Vital signs in last 24 hours: Temp:  [97.8 F (36.6 C)-98.3 F (36.8 C)] 97.8 F (36.6 C) (05/08 0800) Pulse Rate:  [62-80] 67 (05/08 0800) Resp:  [12-28] 12 (05/08 0802) BP: (114-147)/(56-97) 114/66 (05/08 0800) SpO2:  [91 %-100 %] 98 % (05/08 0802) Last BM Date: 01/20/19  Intake/Output from previous day: 05/07 0701 - 05/08 0700 In: 3196.3 [I.V.:2802; IV Piggyback:394.4] Out: -  Intake/Output this shift: Total I/O In: 755.6 [P.O.:240; I.V.:340; IV Piggyback:175.6] Out: -   General appearance: alert and no distress GI: soft, non-tender; bowel sounds normal; no masses,  no organomegaly  Lab Results: Recent Labs    01/21/19 1929 01/22/19 0415  WBC 10.7* 9.6  HGB 14.4 12.2*  HCT 39.9 35.8*  PLT 184 141*   BMET Recent Labs    01/22/19 0415 01/23/19 0307 01/23/19 1745 01/24/19 0250  NA 131* 134*  --  135  K 3.5 2.8* 3.2* 3.4*  CL 98 99  --  100  CO2 24 25  --  23  GLUCOSE 160* 69*  --  90  BUN 7 7  --  5*  CREATININE 0.72 0.72  --  0.76  CALCIUM 8.3* 8.5*  --  8.7*   LFT Recent Labs    01/22/19 0415  PROT 6.0*  ALBUMIN 3.3*  AST 13*  ALT 17  ALKPHOS 46  BILITOT 0.8   PT/INR Recent Labs    01/21/19 1929  LABPROT 12.6  INR 1.0   Hepatitis Panel No results for input(s): HEPBSAG, HCVAB, HEPAIGM, HEPBIGM in the last 72 hours. C-Diff No results for input(s): CDIFFTOX in the last 72 hours. Fecal Lactopherrin No results for input(s): FECLLACTOFRN in the last 72 hours.  Studies/Results: No results found.  Medications:  Scheduled: . Chlorhexidine Gluconate Cloth  6 each Topical Daily  . insulin aspart  0-15 Units Subcutaneous TID WC  . insulin aspart  0-5 Units Subcutaneous QHS  . mouth rinse  15 mL Mouth Rinse BID  . pantoprazole (PROTONIX) IV  40 mg Intravenous Q12H  . sucralfate  1 g Oral Q6H   Continuous: . sodium chloride 75 mL/hr at 01/24/19 1015     Assessment/Plan: 1) Acute pancreatitis secondary to hypertriglyceridemia. 2) Hypertriglyceridemia.   He is well clinically and he self-discontinued his PCA.  His TG has continued to decline, but it is not below 1,000.  Plan: 1) Advance to a carb modified diet as tolerated. 2) Probable D/C home tomorrow. 3) Follow up in the office in 2 weeks.   LOS: 2 days   Misako Roeder D 01/24/2019, 1:36 PM

## 2019-01-24 NOTE — Progress Notes (Signed)
Amp D50 given and CBG rechecked and 184.  Paged Blount, NP via Amion and made NP aware of above and that insulin is on hold.  NP called back and gave order to hold insulin for 1 more hour.

## 2019-01-24 NOTE — Progress Notes (Signed)
PCA dc'd at 1130 am. 21 mg/ml of morphine wasted with Shelda Jakes, RN.

## 2019-01-25 LAB — BASIC METABOLIC PANEL
Anion gap: 9 (ref 5–15)
BUN: 6 mg/dL (ref 6–20)
CO2: 27 mmol/L (ref 22–32)
Calcium: 8.9 mg/dL (ref 8.9–10.3)
Chloride: 104 mmol/L (ref 98–111)
Creatinine, Ser: 0.82 mg/dL (ref 0.61–1.24)
GFR calc Af Amer: >60 mL/min (ref >60)
GFR calc non Af Amer: >60 mL/min (ref >60)
Glucose, Bld: 123 mg/dL — ABNORMAL HIGH (ref 70–99)
Potassium: 4.5 mmol/L (ref 3.5–5.1)
Sodium: 140 mmol/L (ref 135–145)

## 2019-01-25 LAB — GLUCOSE, CAPILLARY
Glucose-Capillary: 112 mg/dL — ABNORMAL HIGH (ref 70–99)
Glucose-Capillary: 137 mg/dL — ABNORMAL HIGH (ref 70–99)

## 2019-01-25 MED ORDER — FENOFIBRATE 160 MG PO TABS: 160.0000 mg | ORAL_TABLET | Freq: Every day | ORAL | 0 refills | Status: AC

## 2019-01-25 MED ORDER — FENOFIBRATE 160 MG PO TABS
160.0000 mg | ORAL_TABLET | Freq: Every day | ORAL | Status: DC
  Administered 2019-01-25: 160 mg via ORAL
  Filled 2019-01-25: qty 1

## 2019-01-25 NOTE — Discharge Summary (Signed)
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  He reported associated nausea and vomiting.  CT abdomen/pelvis revealed inflammatory changes around the pancreatic head and second/third portion of the duodenum concerning for mild acute pancreatitis versus duodenitis.  GI consulted.  Lipid panel showed elevated triglyceride level .  Patient started on insulin drip and moved to ICU.    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  There was some improvement in the triglyceride level with insulin drip.  Abdominal pain has resolved.  Tolerating diet.  He will be put on fenofibrate.  Patient is stable for discharge to home today.  Following problems were addressed during his hospitalization:  Acute pancreatitis: CT finding as above.  Most likely etiology is elevated triglyceride level.  Patient does not drink alcohol. Patient was started on PCA pump for abdominal pain.  GI following.   He was started on insulin drip for hypertriglyceridemia with improvement but surprisingly it did not fall below 1000.  Abdominal pain has significantly improved today.  No nausea or vomiting.  Tolerating diet.  Hypertriglyceridemia: We will start him on fenofibrate.  He should check his lipid panel with triglyceride again in 3 months.  Discussed the importance of healthy diet and regular exercise.  Abdominal pain: Does not complain of any abdominal pain today.   History of diverticulitis:  Status post sigmoid colectomy in 2013.  Hyponatremia: Mild.    Hypokalemia: Supplemented.  Discharge Diagnoses:  Principal Problem:   Acute duodenitis Active Problems:   Chronic hyponatremia   SIRS (systemic inflammatory response syndrome) (HCC)    Discharge Instructions  Discharge Instructions    Diet - low sodium heart healthy   Complete by:  As directed    Discharge instructions   Complete by:  As directed    1)Please take prescribed medications as instructed. 2)Follow up with your PCP in a week.  Do a fasting lipid panel test including triglyceride in 3 months. 3)Take low fat diet. Do regular exercise to lower your body weight.   Increase activity slowly   Complete by:  As directed      Allergies as of 01/25/2019      Reactions   Food Shortness Of Breath, Nausea And Vomiting   Green peas, lentils      Medication List    STOP taking these medications   ciprofloxacin 500 MG tablet Commonly known as:  CIPRO   metroNIDAZOLE 500 MG tablet Commonly known as:  FLAGYL     TAKE these medications   famotidine 20 MG tablet Commonly known as:  PEPCID Take 1 tablet (20 mg total) by mouth 2 (two) times daily.   fenofibrate 160 MG tablet Take 1 tablet (160 mg total) by mouth daily.   HYDROcodone-acetaminophen 5-325 MG tablet Commonly known as:  Norco Take 1 tablet by mouth every 4 (four) hours as needed for severe pain.   oxyCODONE-acetaminophen 5-325 MG tablet Commonly known as:  PERCOCET/ROXICET Take 1 tablet by mouth 2 (two) times daily as needed for moderate pain.   pantoprazole 40 MG tablet Commonly known as:  Protonix Take 1 tablet (  40 mg total) by mouth 2 (two) times daily before a meal.      Follow-up Information    [facility]. Schedule an appointment as soon as possible for a visit in 1 week(s).   Contact information: [address] [place] [address] [phone]          Allergies  Allergen Reactions  . Food  Shortness Of Breath and Nausea And Vomiting    Green peas, lentils    Consultations: GI  Procedures/Studies: Dg Chest 2 View  Result Date: 01/21/2019 CLINICAL DATA:  Right upper quadrant pain EXAM: CHEST - 2 VIEW COMPARISON:  None. FINDINGS: Heart is mildly enlarged. Lungs are clear. No effusions or edema. No acute bony abnormality. IMPRESSION: Mild cardiomegaly.  No active disease. Electronically Signed   By: Charlett Nose M.D.   On: 01/21/2019 19:34   Ct Abdomen Pelvis W Contrast  Result Date: 01/21/2019 CLINICAL DATA:  Abdominal pain and weakness EXAM: CT ABDOMEN AND PELVIS WITH CONTRAST TECHNIQUE: Multidetector CT imaging of the abdomen and pelvis was performed using the standard protocol following bolus administration of intravenous contrast. CONTRAST:  OMNIPAQUE IOHEXOL 300 MG/ML  SOLN COMPARISON:  08/18/2018 FINDINGS: Lower chest: No acute abnormality. Hepatobiliary: No focal liver abnormality is seen. Low attenuation of the liver as can be seen with hepatic steatosis. No gallstones, gallbladder wall thickening, or biliary dilatation. Pancreas: Peripancreatic inflammatory changes around the pancreatic head. Pancreas enhances normally and homogeneously without areas of necrosis. Spleen: Normal in size without focal abnormality. Adrenals/Urinary Tract: Normal adrenal glands. 6.5 cm hypodense, fluid attenuating left renal mass. Punctate nonobstructing left renal calculi. No obstructive uropathy. Normal bladder. Stomach/Bowel: Stomach is within normal limits. Appendix appears normal. No bowel dilatation to suggest bowel obstruction. Inflammatory changes around the pancreatic head and along the second and third portions of the duodenum. Vascular/Lymphatic: No significant vascular findings are present. No enlarged abdominal or pelvic lymph nodes. Reproductive: Prostate is unremarkable. Other: No abdominal wall hernia or abnormality. No abdominopelvic ascites. Musculoskeletal: No acute or  significant osseous findings. IMPRESSION: 1. Inflammatory changes around the pancreatic head and second and third portions of the duodenum. Differential considerations include mild acute pancreatitis versus duodenitis. Electronically Signed   By: Elige Ko   On: 01/21/2019 21:58       Subjective: Patient seen and examined the bedside this morning.  Remains comfortable.  Denies any abdominal pain.  Hemodynamically stable for discharge.  Discharge Exam: Vitals:   01/24/19 2107 01/25/19 0455  BP: (!) 142/87 123/81  Pulse: 72 71  Resp: 16 18  Temp: 98.9 F (37.2 C) 97.8 F (36.6 C)  SpO2: 100% 100%   Vitals:   01/24/19 0802 01/24/19 1417 01/24/19 2107 01/25/19 0455  BP:  137/88 (!) 142/87 123/81  Pulse:  79 72 71  Resp: 12 18 16 18   Temp:  98.6 F (37 C) 98.9 F (37.2 C) 97.8 F (36.6 C)  TempSrc:  Oral Oral Oral  SpO2: 98% 97% 100% 100%  Weight:      Height:        General: Pt is alert, awake, not in acute distress Cardiovascular: RRR, S1/S2 +, no rubs, no gallops Respiratory: CTA bilaterally, no wheezing, no rhonchi Abdominal: Soft, NT, ND, bowel sounds + Extremities: no edema, no cyanosis    The results of significant diagnostics from this hospitalization (including imaging, microbiology, ancillary and laboratory) are listed below for reference.     Microbiology: Recent Results (from the past 240  hour(s))  Culture, blood (Routine x 2)     Status: None (Preliminary result)   Collection Time: 01/21/19  7:19 PM  Result Value Ref Range Status   Specimen Description   Final    BLOOD LEFT ANTECUBITAL Performed at [facility], [address], [place], [place] [address]    Special Requests   Final    BOTTLES DRAWN AEROBIC AND ANAEROBIC Blood Culture results may not be optimal due to an excessive volume of blood received in culture bottles Performed at [facility], [address], [place], [place] [address]    Culture   Final     NO GROWTH 3 DAYS Performed at [facility], [address] [place], [place] [address]    Report Status PENDING  Incomplete  Culture, blood (Routine x 2)     Status: None (Preliminary result)   Collection Time: 01/21/19  8:04 PM  Result Value Ref Range Status   Specimen Description   Final    BLOOD RIGHT ARM Performed at [facility], [address] [place], [place] [address]    Special Requests   Final    BOTTLES DRAWN AEROBIC AND ANAEROBIC Blood Culture adequate volume Performed at [facility], [address], [place], [place] [address]    Culture   Final    NO GROWTH 3 DAYS Performed at [facility], [address] [place], [place] [address]    Report Status PENDING  Incomplete  SARS Coronavirus 2 (CEPHEID - Performed in [facility] hospital lab), Hosp Order     Status: None   Collection Time: 01/21/19  8:05 PM  Result Value Ref Range Status   SARS Coronavirus 2 NEGATIVE NEGATIVE Final    Comment: (NOTE) If result is NEGATIVE SARS-CoV-2 target nucleic acids are NOT DETECTED. The SARS-CoV-2 RNA is generally detectable in upper and lower  respiratory specimens during the acute phase of infection. The lowest  concentration of SARS-CoV-2 viral copies this assay can detect is 250  copies / mL. A negative result does not preclude SARS-CoV-2 infection  and should not be used as the sole basis for treatment or other  patient management decisions.  A negative result may occur with  improper specimen collection / handling, submission of specimen other  than nasopharyngeal swab, presence of viral mutation(s) within the  areas targeted by this assay, and inadequate number of viral copies  (<250 copies / mL). A negative result must be combined with clinical  observations, patient history, and epidemiological information. If result is POSITIVE SARS-CoV-2 target nucleic acids are DETECTED. The SARS-CoV-2 RNA is generally detectable  in upper and lower  respiratory specimens dur ing the acute phase of infection.  Positive  results are indicative of active infection with SARS-CoV-2.  Clinical  correlation with patient history and other diagnostic information is  necessary to determine patient infection status.  Positive results do  not rule out bacterial infection or co-infection with other viruses. If result is PRESUMPTIVE POSTIVE SARS-CoV-2 nucleic acids MAY BE PRESENT.   A presumptive positive result was obtained on the submitted specimen  and confirmed on repeat testing.  While 2019 novel coronavirus  (SARS-CoV-2) nucleic acids may be present in the submitted sample  additional confirmatory testing may be necessary for epidemiological  and / or clinical management purposes  to differentiate between  SARS-CoV-2 and other Sarbecovirus currently known to infect humans.  If clinically indicated additional testing with an alternate  test  methodology [phone]) is advised. The SARS-CoV-2 RNA is generally  detectable in upper and lower respiratory sp ecimens during the acute  phase of infection. The expected result is Negative. Fact Sheet for Patients:  BoilerBrush.com.cy Fact Sheet for Healthcare Providers: https://pope.com/ This test is not yet approved or cleared by the [place] FDA and has been authorized for detection and/or diagnosis of SARS-CoV-2 by FDA under an Emergency Use Authorization (EUA).  This EUA will remain in effect (meaning this test can be used) for the duration of the COVID-19 declaration under Section 564(b)(1) of the Act, 21 U.S.C. section 360bbb-3(b)(1), unless the authorization is terminated or revoked sooner. Performed at [facility], [address], [place], [place] [address]   MRSA PCR Screening     Status: None   Collection Time: 01/22/19  2:47 PM  Result Value Ref Range Status   MRSA by PCR NEGATIVE NEGATIVE  Final    Comment:        The GeneXpert MRSA Assay (FDA approved for NASAL specimens only), is one component of a comprehensive MRSA colonization surveillance program. It is not intended to diagnose MRSA infection nor to guide or monitor treatment for MRSA infections. Performed at [facility], [address], [place], [place] [address]      Labs: BNP (last 3 results) No results for input(s): BNP in the last 8760 hours. Basic Metabolic Panel: Recent Labs  Lab 01/21/19 1928 01/22/19 0415 01/23/19 0307 01/23/19 1745 01/24/19 0250 01/25/19 0405  NA 128* 131* 134*  --  135 140  K 3.5 3.5 2.8* 3.2* 3.4* 4.5  CL 94* 98 99  --  100 104  CO2 --  23 27  GLUCOSE 168* 160* 69*  --  90 123*  BUN --  5* 6  CREATININE 0.84 0.72 0.72  --  0.76 0.82  CALCIUM 9.1 8.3* 8.5*  --  8.7* 8.9  MG  --   --  2.1  --   --   --    Liver Function Tests: Recent Labs  Lab 01/21/19 1928 01/22/19 0415  AST 19 13*  ALT 21 17  ALKPHOS 55 46  BILITOT 1.2 0.8  PROT 6.9 6.0*  ALBUMIN 3.9 3.3*   Recent Labs  Lab 01/21/19 1928 01/22/19 0415  LIPASE 46 35   No results for input(s): AMMONIA in the last 168 hours. CBC: Recent Labs  Lab 01/21/19 1929 01/22/19 0415  WBC 10.7* 9.6  NEUTROABS 8.4* 7.4  HGB 14.4 12.2*  HCT 39.9 35.8*  MCV 85.6 88.2  PLT 184 141*   Cardiac Enzymes: No results for input(s): CKTOTAL, CKMB, CKMBINDEX, TROPONINI in the last 168 hours. BNP: Invalid input(s): POCBNP CBG: Recent Labs  Lab 01/24/19 0944 01/24/19 1204 01/24/19 1632 01/24/19 2109 01/25/19 0741  GLUCAP 178* 168* 124* 172* 112*   D-Dimer No results for input(s): DDIMER in the last 72 hours. Hgb A1c No results for input(s): HGBA1C in the last 72 hours. Lipid Profile Recent Labs    01/23/19 1745 01/24/19 0250  TRIG 1,364* 1,150*   Thyroid function studies No results for input(s): TSH, T4TOTAL, T3FREE, THYROIDAB in the last 72 hours.  Invalid  input(s): FREET3 Anemia work up No results for input(s): VITAMINB12, FOLATE, FERRITIN, TIBC, IRON, RETICCTPCT in the last 72 hours. Urinalysis    Component Value Date/Time   COLORURINE YELLOW 01/21/2019 1928   APPEARANCEUR CLEAR 01/21/2019 1928   LABSPEC 1.017 01/21/2019  1928   PHURINE 5.0 01/21/2019 1928   GLUCOSEU 50 (A) 01/21/2019 1928   HGBUR NEGATIVE 01/21/2019 1928   BILIRUBINUR NEGATIVE 01/21/2019 1928   KETONESUR 5 (A) 01/21/2019 1928   PROTEINUR NEGATIVE 01/21/2019 1928   UROBILINOGEN 0.2 05/20/2012 0215   NITRITE NEGATIVE 01/21/2019 1928   LEUKOCYTESUR TRACE (A) 01/21/2019 1928   Sepsis Labs Invalid input(s): PROCALCITONIN,  WBC,  LACTICIDVEN Microbiology Recent Results (from the past 240 hour(s))  Culture, blood (Routine x 2)     Status: None (Preliminary result)   Collection Time: 01/21/19  7:19 PM  Result Value Ref Range Status   Specimen Description   Final    BLOOD LEFT ANTECUBITAL Performed at [facility], [address] [place], [place] [address]    Special Requests   Final    BOTTLES DRAWN AEROBIC AND ANAEROBIC Blood Culture results may not be optimal due to an excessive volume of blood received in culture bottles Performed at [facility], [address] [place], [place] [address]    Culture   Final    NO GROWTH 3 DAYS Performed at [facility], [address] [place], [place] [address]    Report Status PENDING  Incomplete  Culture, blood (Routine x 2)     Status: None (Preliminary result)   Collection Time: 01/21/19  8:04 PM  Result Value Ref Range Status   Specimen Description   Final    BLOOD RIGHT ARM Performed at [facility], [address] [place], [place] [address]    Special Requests   Final    BOTTLES DRAWN AEROBIC AND ANAEROBIC Blood Culture adequate volume Performed at [facility], [address] [place], [place] [address]    Culture   Final    NO  GROWTH 3 DAYS Performed at [facility], [address] [place], [place] [address]    Report Status PENDING  Incomplete  SARS Coronavirus 2 (CEPHEID - Performed in [facility] hospital lab), Hosp Order     Status: None   Collection Time: 01/21/19  8:05 PM  Result Value Ref Range Status   SARS Coronavirus 2 NEGATIVE NEGATIVE Final    Comment: (NOTE) If result is NEGATIVE SARS-CoV-2 target nucleic acids are NOT DETECTED. The SARS-CoV-2 RNA is generally detectable in upper and lower  respiratory specimens during the acute phase of infection. The lowest  concentration of SARS-CoV-2 viral copies this assay can detect is 250  copies / mL. A negative result does not preclude SARS-CoV-2 infection  and should not be used as the sole basis for treatment or other  patient management decisions.  A negative result may occur with  improper specimen collection / handling, submission of specimen other  than nasopharyngeal swab, presence of viral mutation(s) within the  areas targeted by this assay, and inadequate number of viral copies  (<250 copies / mL). A negative result must be combined with clinical  observations, patient history, and epidemiological information. If result is POSITIVE SARS-CoV-2 target nucleic acids are DETECTED. The SARS-CoV-2 RNA is generally detectable in upper and lower  respiratory specimens dur ing the acute phase of infection.  Positive  results are indicative of active infection with SARS-CoV-2.  Clinical  correlation with patient history and other diagnostic information is  necessary to determine patient infection status.  Positive results do  not rule out bacterial infection or co-infection with other viruses. If result is PRESUMPTIVE POSTIVE SARS-CoV-2 nucleic acids MAY  BE PRESENT.   A presumptive positive result was obtained on the submitted specimen  and confirmed on repeat testing.  While 2019 novel coronavirus  (SARS-CoV-2) nucleic acids may be present  in the submitted sample  additional confirmatory testing may be necessary for epidemiological  and / or clinical management purposes  to differentiate between  SARS-CoV-2 and other Sarbecovirus currently known to infect humans.  If clinically indicated additional testing with an alternate test  methodology [phone]) is advised. The SARS-CoV-2 RNA is generally  detectable in upper and lower respiratory sp ecimens during the acute  phase of infection. The expected result is Negative. Fact Sheet for Patients:  BoilerBrush.com.cy Fact Sheet for Healthcare Providers: https://pope.com/ This test is not yet approved or cleared by the [place] FDA and has been authorized for detection and/or diagnosis of SARS-CoV-2 by FDA under an Emergency Use Authorization (EUA).  This EUA will remain in effect (meaning this test can be used) for the duration of the COVID-19 declaration under Section 564(b)(1) of the Act, 21 U.S.C. section 360bbb-3(b)(1), unless the authorization is terminated or revoked sooner. Performed at [facility], [address] [place], [place] [address]   MRSA PCR Screening     Status: None   Collection Time: 01/22/19  2:47 PM  Result Value Ref Range Status   MRSA by PCR NEGATIVE NEGATIVE Final    Comment:        The GeneXpert MRSA Assay (FDA approved for NASAL specimens only), is one component of a comprehensive MRSA colonization surveillance program. It is not intended to diagnose MRSA infection nor to guide or monitor treatment for MRSA infections. Performed at [facility], [address] [place], [place] [address]     Please note: You were cared for by a hospitalist during your hospital stay. Once you are discharged, your primary care physician will handle any further medical issues. Please note that NO REFILLS for any discharge medications will be authorized once you are  discharged, as it is imperative that you return to your primary care physician (or establish a relationship with a primary care physician if you do not have one) for your post hospital discharge needs so that they can reassess your need for medications and monitor your lab values.    Time coordinating discharge: 40 minutes  SIGNED:   Burnadette Pop, MD  [facility] 01/25/2019, 11:14 AM Pager [phone]  If 7PM-7AM, please contact night-coverage [email or web address] Password [email or web address]

## 2019-01-26 LAB — CULTURE, BLOOD (ROUTINE X 2)
Culture: NO GROWTH
Culture: NO GROWTH
Special Requests: ADEQUATE

## 2019-08-20 IMAGING — CT CT ABDOMEN AND PELVIS WITH CONTRAST
2 of 5 series · 16 of 46 positions shown, 18 images · IV contrast (ISOVUE)
Comparison: 08/18/2018

CLINICAL DATA: Abdominal pain and weakness

EXAM:
CT ABDOMEN AND PELVIS WITH CONTRAST
TECHNIQUE: Multidetector CT imaging of the abdomen and pelvis was performed
using the standard protocol following bolus administration of
intravenous contrast.
CONTRAST:  100mL OMNIPAQUE IOHEXOL 300 MG/ML  SOLN

[Series 2: axial st · axial · 0.94mm/px · z∈[+1117,+1572]mm · 13 of 105 slices shown, 15 images]
[im 7/105  soft-tissue]
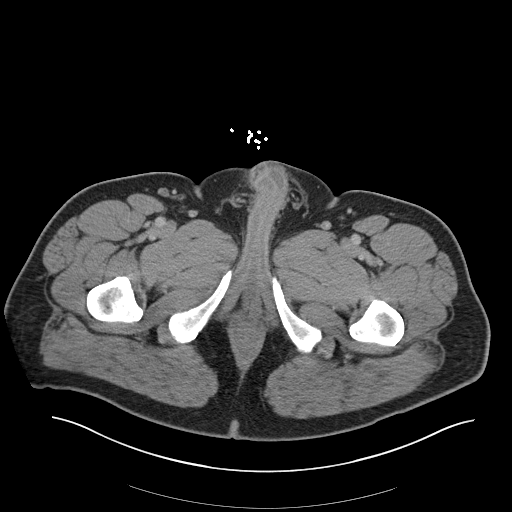
[im 7/105  bone]
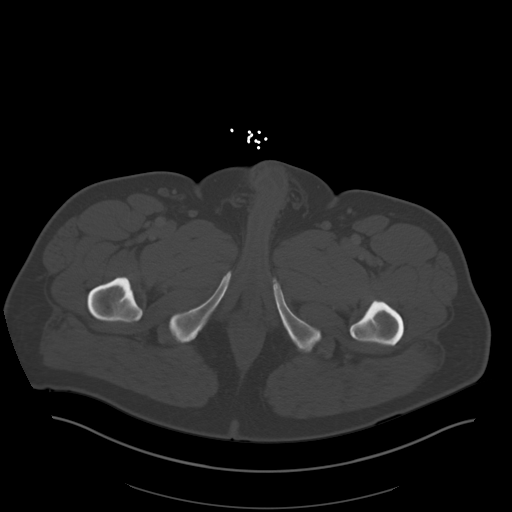
[im 14/105  soft-tissue]
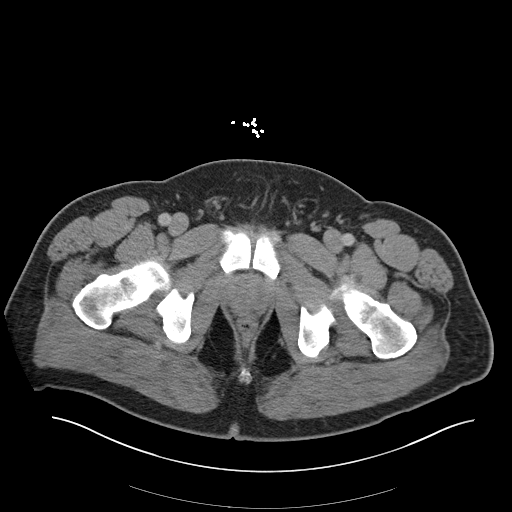
[im 20/105  soft-tissue]
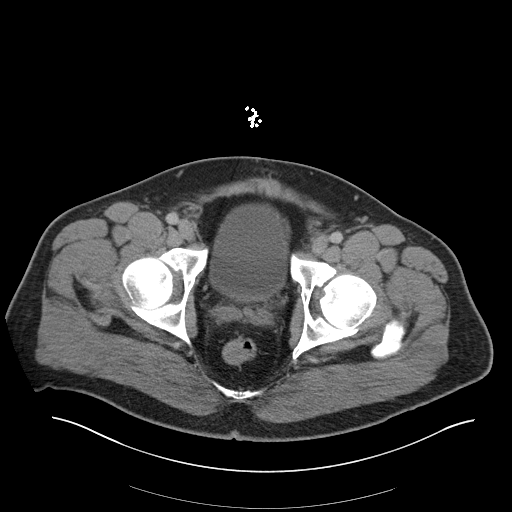
[im 33/105  soft-tissue]
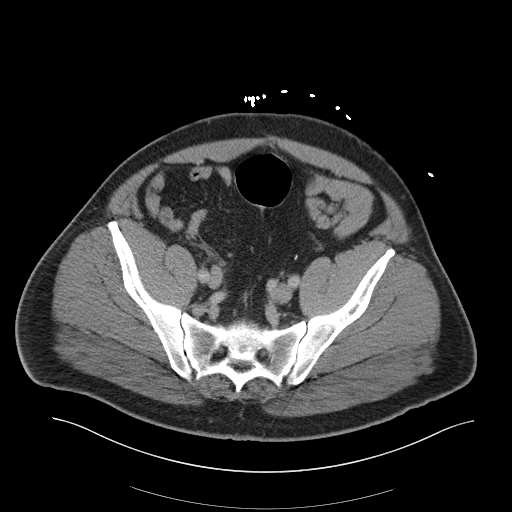
[im 40/105  soft-tissue]
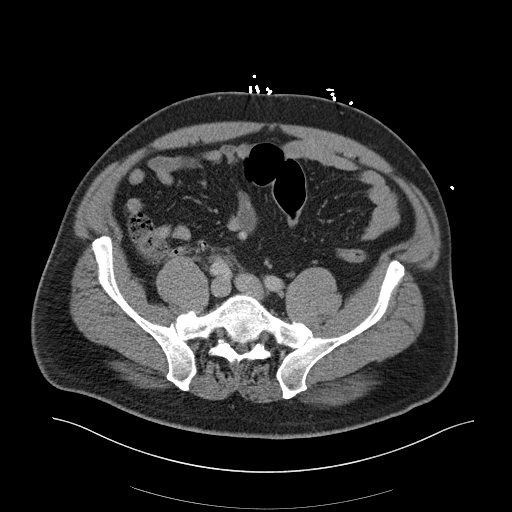
[im 46/105  soft-tissue]
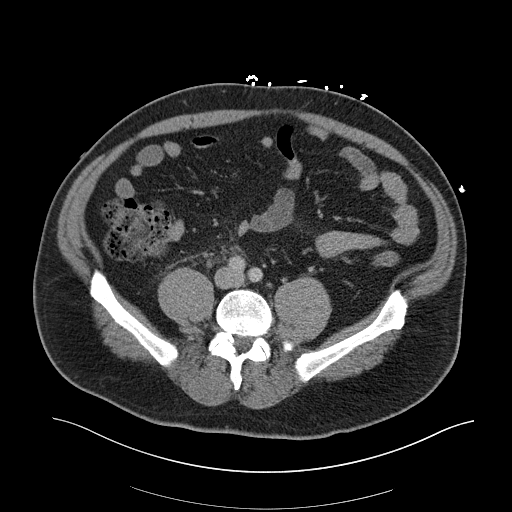
[im 53/105  soft-tissue]
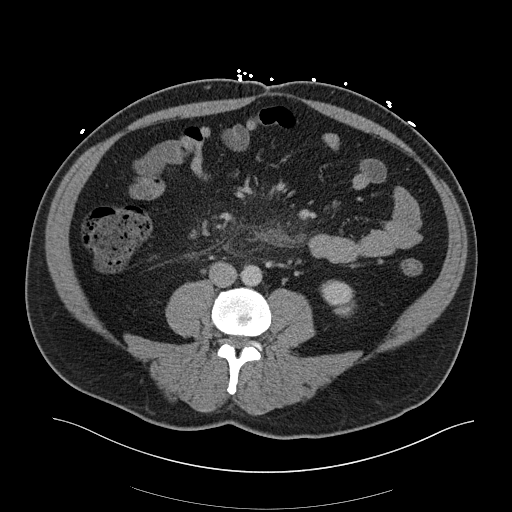
[im 59/105  soft-tissue]
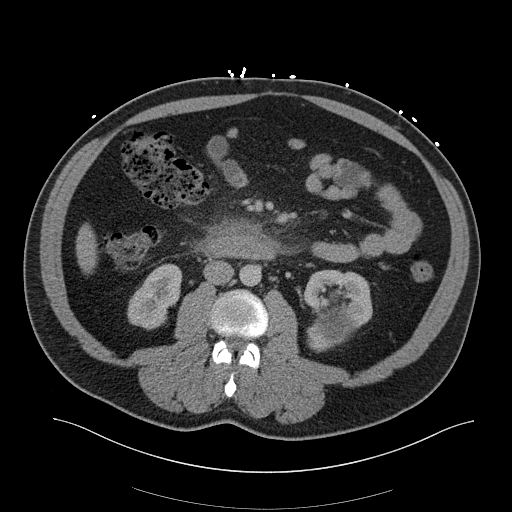
[im 66/105  soft-tissue]
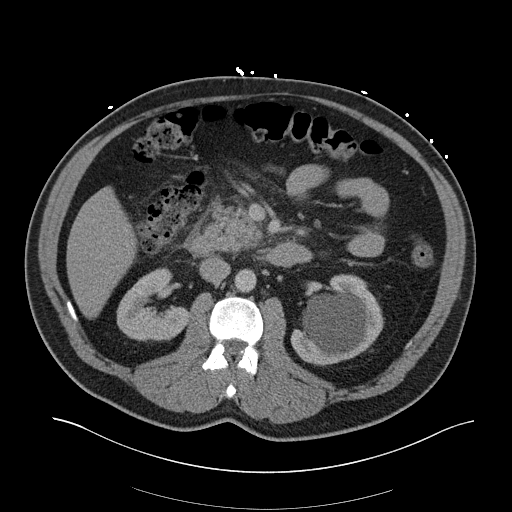
[im 66/105  bone]
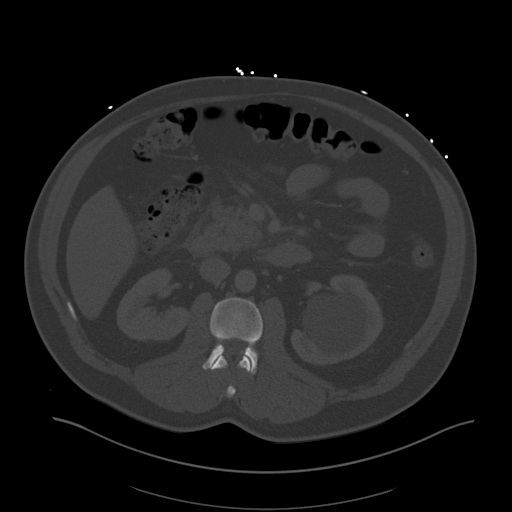
[im 72/105  soft-tissue]
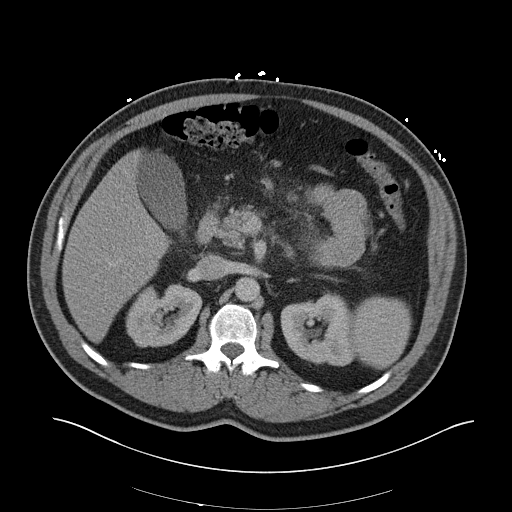
[im 85/105  soft-tissue]
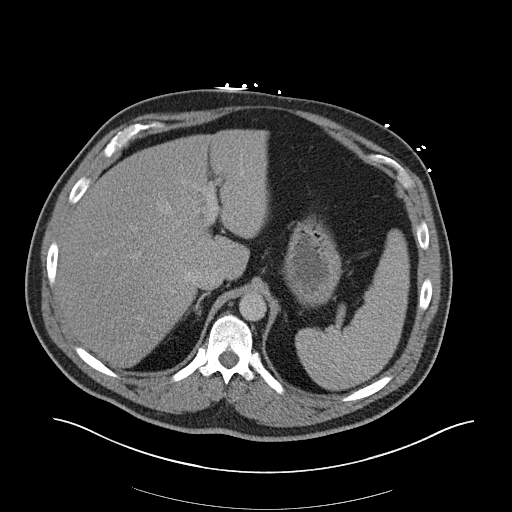
[im 92/105  soft-tissue]
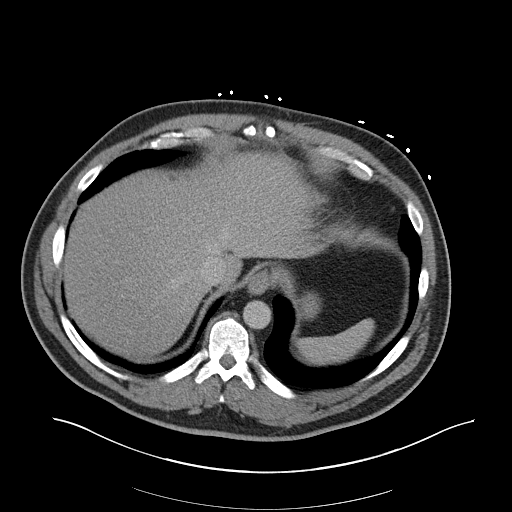
[im 98/105  soft-tissue]
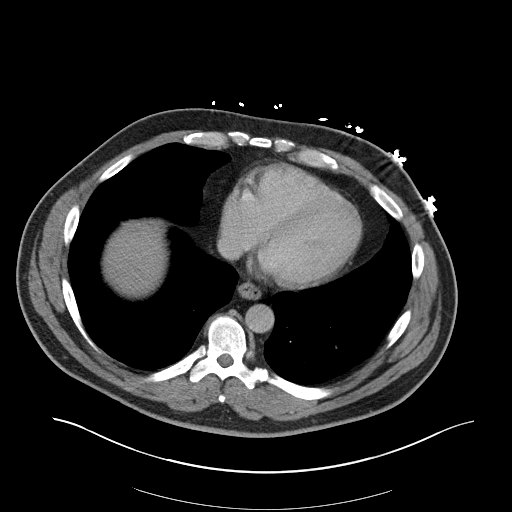

[Series 5: coronal st · coronal · 0.94mm/px · 3 of 168 slices shown]
[im 56/168  soft-tissue]
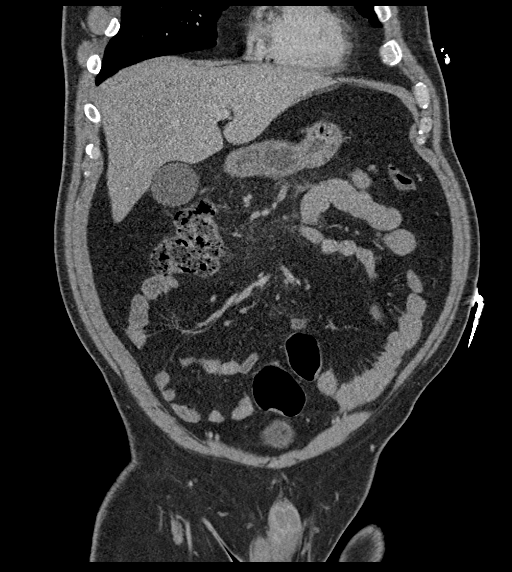
[im 75/168  soft-tissue]
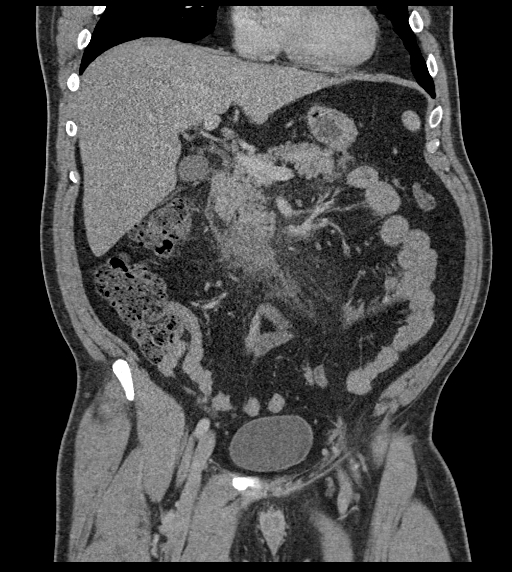
[im 93/168  soft-tissue]
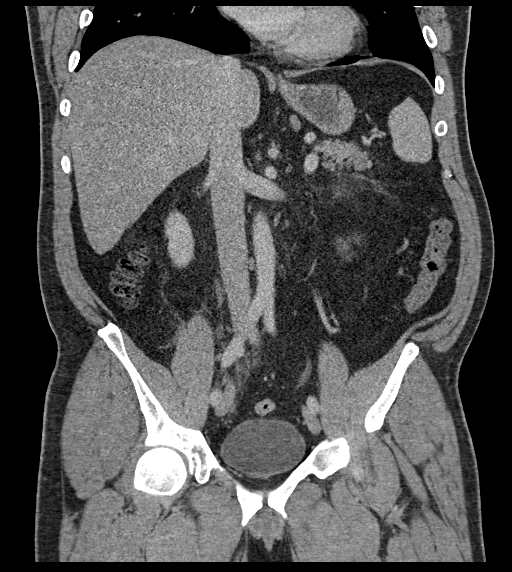

[16 of 46 positions shown; findings below may reference images not displayed]

FINDINGS: Lower chest: No acute abnormality.

Hepatobiliary: No focal liver abnormality is seen. Low attenuation
of the liver as can be seen with hepatic steatosis. No gallstones,
gallbladder wall thickening, or biliary dilatation.

Pancreas: Peripancreatic inflammatory changes around the pancreatic
head. Pancreas enhances normally and homogeneously without areas of
necrosis.

Spleen: Normal in size without focal abnormality.

Adrenals/Urinary Tract: Normal adrenal glands. 6.5 cm hypodense,
fluid attenuating left renal mass. Punctate nonobstructing left
renal calculi. No obstructive uropathy. Normal bladder.

Stomach/Bowel: Stomach is within normal limits. Appendix appears
normal. No bowel dilatation to suggest bowel obstruction.
Inflammatory changes around the pancreatic head and along the second
and third portions of the duodenum.

Vascular/Lymphatic: No significant vascular findings are present. No
enlarged abdominal or pelvic lymph nodes.

Reproductive: Prostate is unremarkable.

Other: No abdominal wall hernia or abnormality. No abdominopelvic
ascites.

Musculoskeletal: No acute or significant osseous findings.
IMPRESSION: 1. Inflammatory changes around the pancreatic head and second and
third portions of the duodenum. Differential considerations include
mild acute pancreatitis versus duodenitis.

## 2019-08-20 IMAGING — CR CHEST - 2 VIEW
3 series · 3 of 3 positions shown · non-contrast
Comparison: None.

CLINICAL DATA: Right upper quadrant pain

EXAM:
CHEST - 2 VIEW

[w chest pa (1 of 2)]
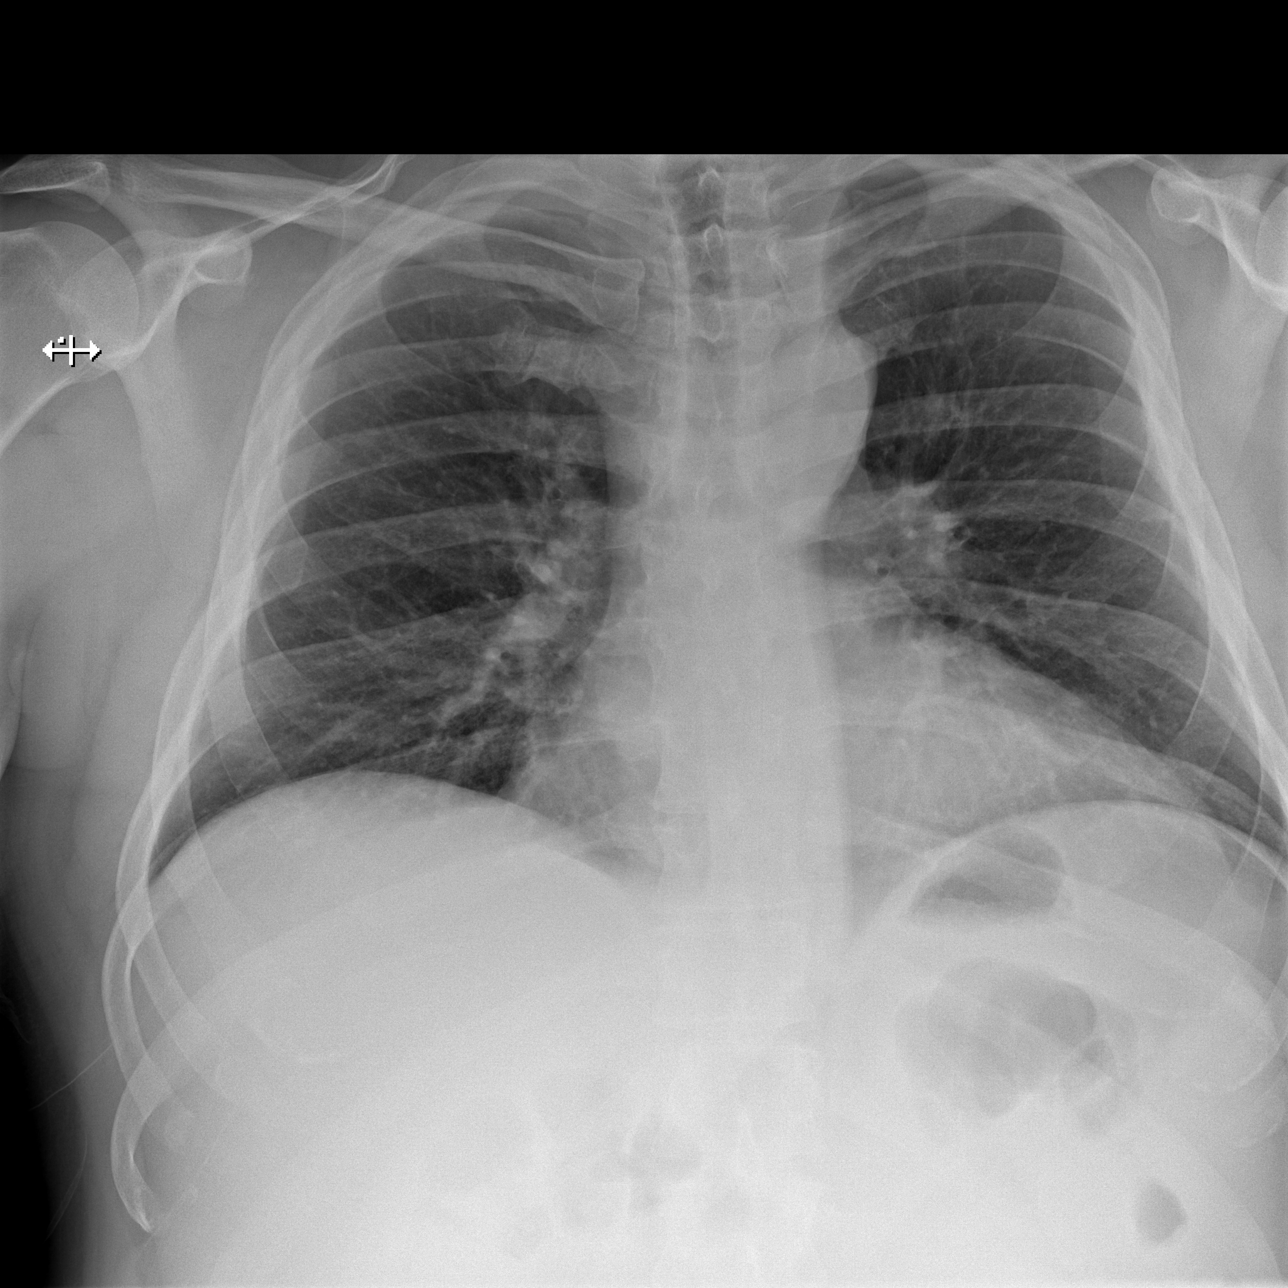

[w chest lat]
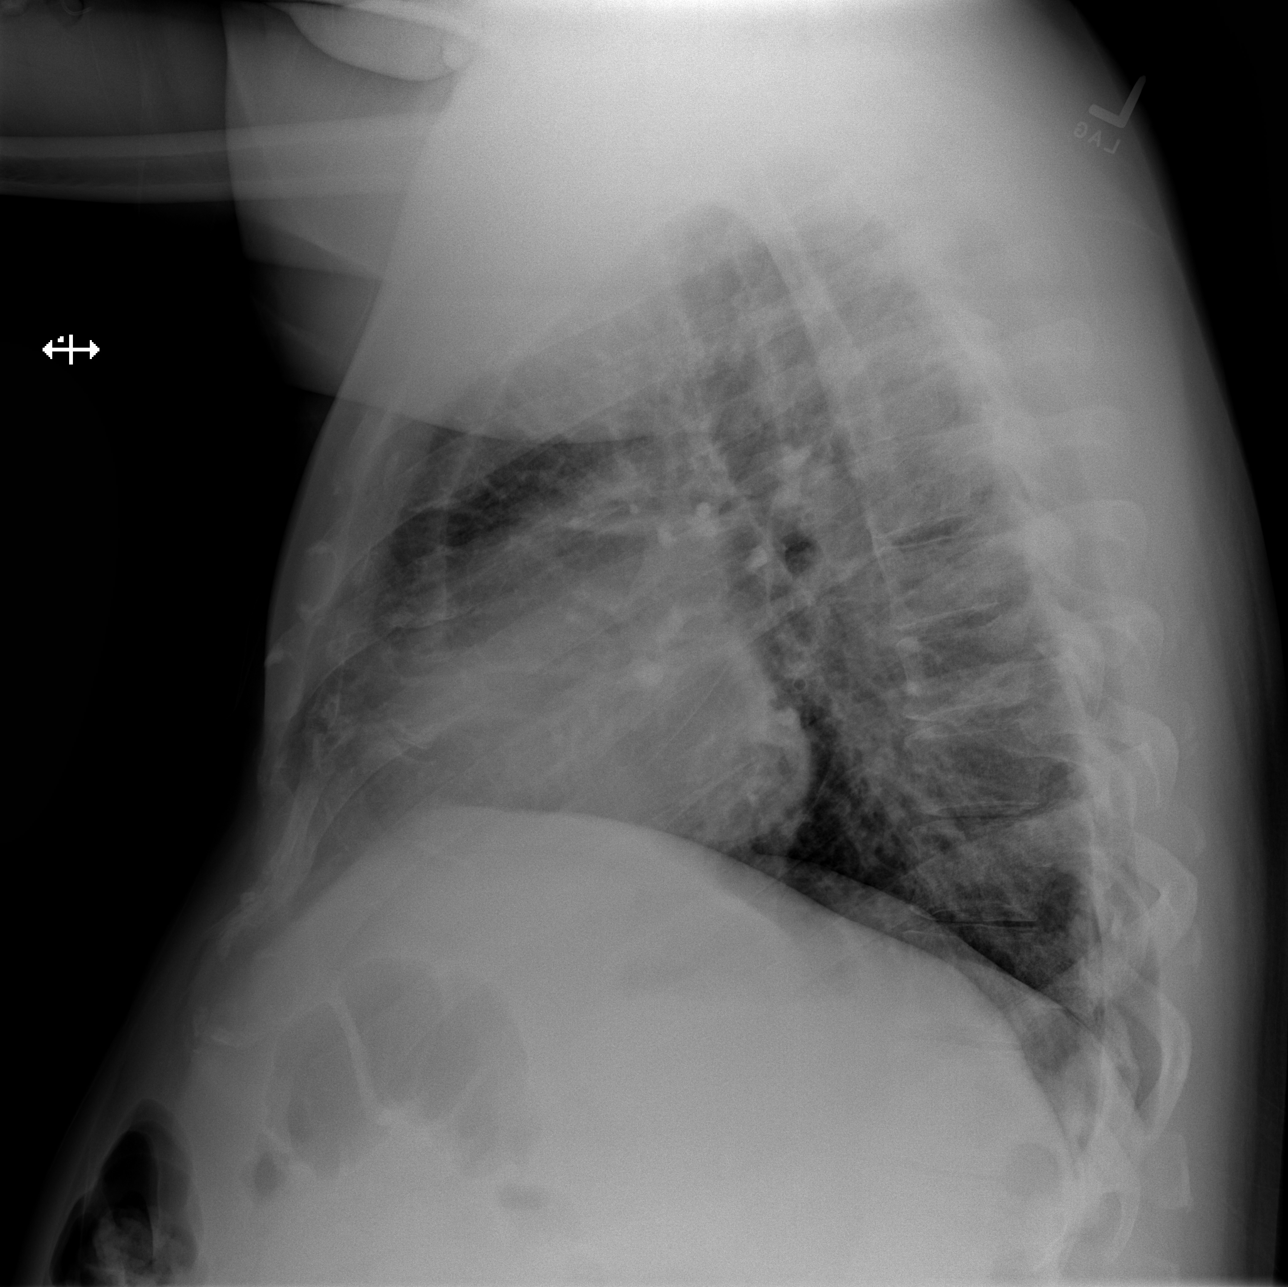

[w chest pa (2 of 2)]
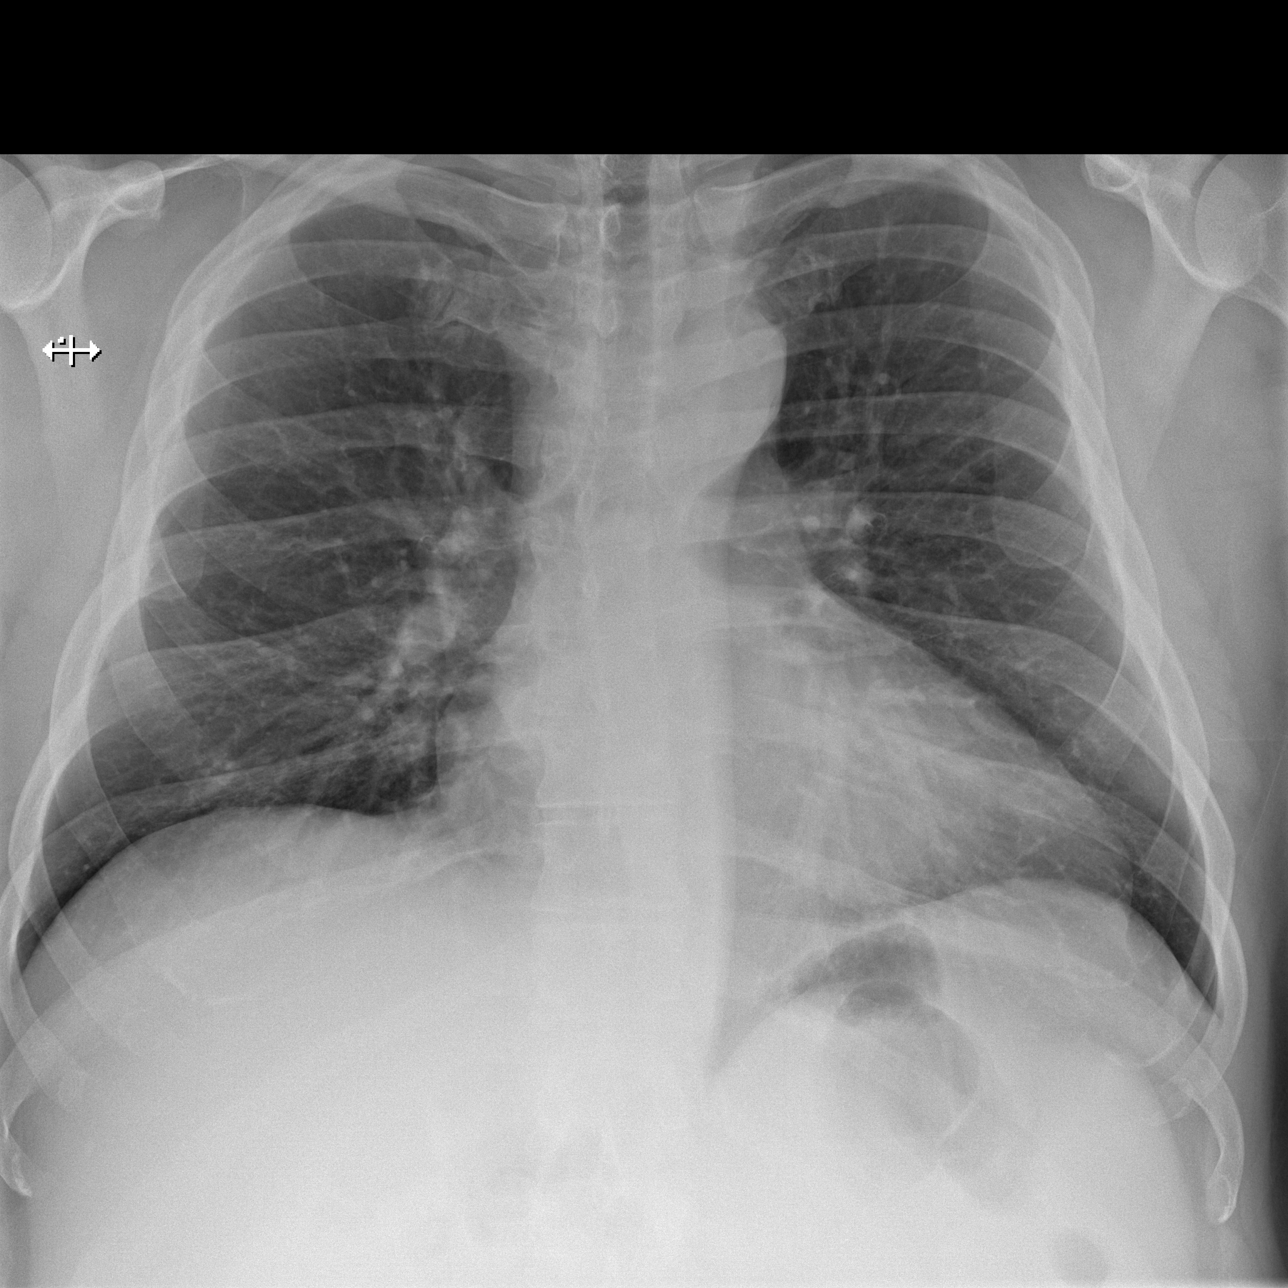

[3 of 3 positions shown; findings below may reference images not displayed]

FINDINGS: Heart is mildly enlarged. Lungs are clear. No effusions or edema. No
acute bony abnormality.
IMPRESSION: Mild cardiomegaly.  No active disease.

## 2021-04-08 NOTE — Progress Notes (Signed)
Date:  04/11/2021   ID:  Mark Silva, DOB 1976-07-29, MRN [id number]  PCP:  Lindaann Pascal, PA-C  Cardiologist:  Tessa Lerner, DO, [facility]  (established care 04/11/2021)  REASON FOR CONSULT: Family history of ischemic heart disease  REQUESTING PHYSICIAN:  [facility] [address] [place],  [place] [address]  Chief Complaint  Patient presents with   Family history of ischemic heart disease    New Patient (Initial Visit)    HPI  Mark Silva is a 45 y.o. Caucasian male who presents to the office with a chief complaint of " cardiovascular evaluation given family history of premature CAD and multiple risk factors." Patient's past medical history and cardiovascular risk factors include: Non insulin-dependent diabetes mellitus type 2, hypertriglyceridemia, hyperlipidemia, pancreatitis secondary to elevated triglyceride levels, family history premature CAD.  He is referred to the office at the request of [facility] for evaluation of family history of ischemic heart disease.  Very pleasant 45 year old gentleman who presents to the office to be evaluated for coronary artery disease given his family history of premature CAD and multiple cardiovascular risk factors as noted above.  Patient states that over the last 7 to 8 months he has been working on his lifestyle changes and has implemented regular exercise which has resulted in approximately 47 pounds of weight loss.  He plans to move to [place] for approximately 12 months and is here for a cardiovascular check prior to traveling to [place].  Home blood pressures usually range between 120-130 mmHg per patient.  Father had premature CAD with four-vessel bypass around the age of 47.  FUNCTIONAL STATUS: Treadmill 35 to 40 minutes 3 to 5 days a week.  ALLERGIES: Allergies  Allergen Reactions   Food Shortness Of Breath and Nausea And Vomiting    Green peas, lentils    MEDICATION LIST PRIOR TO  VISIT: Current Meds  Medication Sig   fenofibrate 160 MG tablet Take 1 tablet (160 mg total) by mouth daily.   metFORMIN (GLUCOPHAGE) 500 MG tablet Take 500 mg by mouth 2 (two) times daily.   metoprolol tartrate (LOPRESSOR) 50 MG tablet Take 1 tablet (50 mg total) by mouth 2 (two) times daily.   phentermine (ADIPEX-P) 37.5 MG tablet Take 37.5 mg by mouth daily.   rosuvastatin (CRESTOR) 20 MG tablet Take 20 mg by mouth daily.     PAST MEDICAL HISTORY: Past Medical History:  Diagnosis Date   Diabetes mellitus without complication (HCC)    Diverticulitis    H pylori ulcer    H/O abdominal abscess    Hyperlipidemia    Hypertriglyceridemia    Pancreatitis    Pneumonia     PAST SURGICAL HISTORY: Past Surgical History:  Procedure Laterality Date   BACK SURGERY     COLON SURGERY      FAMILY HISTORY: The patient family history includes Cancer in his father; Heart disease in his father and mother; Hypertension in his brother, father, mother, and sister.  SOCIAL HISTORY:  The patient  reports that he has never smoked. He has never used smokeless tobacco. He reports current alcohol use. He reports that he does not use drugs.  REVIEW OF SYSTEMS: Review of Systems  Constitutional: Negative for chills and fever.  HENT:  Negative for hoarse voice and nosebleeds.   Eyes:  Negative for discharge, double vision and pain.  Cardiovascular:  Negative for chest pain, claudication, dyspnea on exertion, leg swelling, near-syncope, orthopnea, palpitations, paroxysmal nocturnal dyspnea and  syncope.  Respiratory:  Negative for hemoptysis and shortness of breath.   Musculoskeletal:  Negative for muscle cramps and myalgias.  Gastrointestinal:  Negative for abdominal pain, constipation, diarrhea, hematemesis, hematochezia, melena, nausea and vomiting.  Neurological:  Negative for dizziness and light-headedness.   PHYSICAL EXAM: Vitals with BMI 04/11/2021 04/11/2021 01/25/2019  Height - 6\' 0"  -  Weight  - 236 lbs -  BMI - 32 -  Systolic 142 159  Diastolic 82 87 81  Pulse 78 91 71   CONSTITUTIONAL: Well-developed and well-nourished. No acute distress.  SKIN: Skin is warm and dry. No rash noted. No cyanosis. No pallor. No jaundice HEAD: Normocephalic and atraumatic.  EYES: No scleral icterus MOUTH/THROAT: Moist oral membranes.  NECK: No JVD present. No thyromegaly noted. No carotid bruits  LYMPHATIC: No visible cervical adenopathy.  CHEST Normal respiratory effort. No intercostal retractions  LUNGS: Clear to auscultation bilaterally no stridor. No wheezes. No rales.  CARDIOVASCULAR: Regular rate and rhythm, positive S1-S2, no murmurs rubs or gallops appreciated ABDOMINAL: Soft, nontender, nondistended, positive bowel sounds in all 4 quadrants, no apparent ascites.  EXTREMITIES: No peripheral edema  HEMATOLOGIC: No significant bruising NEUROLOGIC: Oriented to person, place, and time. Nonfocal. Normal muscle tone.  PSYCHIATRIC: Normal mood and affect. Normal behavior. Cooperative  CARDIAC DATABASE: EKG: 04/11/2021: Normal sinus rhythm, 65 bpm, normal axis, without underlying ischemia or injury pattern.  Echocardiogram: No results found for this or any previous visit from the past 1095 days.   Stress Testing: No results found for this or any previous visit from the past 1095 days.  Heart Catheterization: None  LABORATORY DATA: CBC Latest Ref Rng & Units 01/22/2019 01/21/2019 08/18/2018  WBC 4.0 - 10.5 K/uL 9.6 10.7(H) 9.4  Hemoglobin 13.0 - 17.0 g/dL 12.2(L) 14.4 16.0  Hematocrit 39.0 - 52.0 % 35.8(L) 39.9 44.0  Platelets 150 - 400 K/uL 141(L) 184 195    CMP Latest Ref Rng & Units 01/25/2019 01/24/2019 01/23/2019  Glucose 70 - 99 mg/dL 03/25/2019) 90 -  BUN 6 - 20 mg/dL 6 5(L) -  Creatinine 149(F - 1.24 mg/dL 0.26 3.78 -  Sodium 5.88 - 145 mmol/L 140 135 -  Potassium 3.5 - 5.1 mmol/L 4.5 3.4(L) 3.2(L)  Chloride 98 - 111 mmol/L 104 100 -  CO2 22 - 32 mmol/L 27 23 -  Calcium 8.9 - 10.3  mg/dL 8.9 502) -  Total Protein 6.5 - 8.1 g/dL - - -  Total Bilirubin 0.3 - 1.2 mg/dL - - -  Alkaline Phos 38 - 126 U/L - - -  AST 15 - 41 U/L - - -  ALT 0 - 44 U/L - - -    Lipid Panel  Lab Results  Component Value Date   CHOL 432 (H) 01/22/2019   HDL NOT REPORTED DUE TO HIGH TRIGLYCERIDES 01/22/2019   LDLCALC UNABLE TO CALCULATE IF TRIGLYCERIDE OVER 400 mg/dL 03/24/2019   LDLDIRECT NOT CALCULATED 01/22/2019   TRIG 1,150 (H) 01/24/2019   CHOLHDL NOT REPORTED DUE TO HIGH TRIGLYCERIDES 01/22/2019   No components found for: NTPROBNP No results for input(s): PROBNP in the last 8760 hours. No results for input(s): TSH in the last 8760 hours.  BMP No results for input(s): NA, K, CL, CO2, GLUCOSE, BUN, CREATININE, CALCIUM, GFRNONAA, GFRAA in the last 8760 hours.  HEMOGLOBIN A1C No results found for: HGBA1C, MPG  IMPRESSION:    ICD-10-CM   1. Family history of ischemic heart disease  Z82.49 EKG 12-Lead    CT CORONARY  MORPH W/CTA COR W/SCORE W/CA W/CM &/OR WO/CM    PCV ECHOCARDIOGRAM COMPLETE    Basic metabolic panel    metoprolol tartrate (LOPRESSOR) 50 MG tablet    2. Pure hypercholesterolemia  E78.00 CT CORONARY MORPH W/CTA COR W/SCORE W/CA W/CM &/OR WO/CM    3. Hypertriglyceridemia  E78.1 CT CORONARY MORPH W/CTA COR W/SCORE W/CA W/CM &/OR WO/CM    4. Non-insulin dependent type 2 diabetes mellitus (HCC)  E11.9 CT CORONARY MORPH W/CTA COR W/SCORE W/CA W/CM &/OR WO/CM       RECOMMENDATIONS: Mark Silva is a 45 y.o. male whose past medical history and cardiac risk factors include: Non insulin-dependent diabetes mellitus type 2, hypertriglyceridemia, hyperlipidemia, pancreatitis secondary to hypertriglyceride levels, family history premature CAD.  Patient has a very strong family history of premature CAD with his father having a coronary event in his 72s.  And the patient himself has multiple cardiovascular risk factors including hypercholesterolemia, hypertriglyceridemia  and non-insulin-dependent diabetes mellitus type 2.  Patient states that during the COVID-19 pandemic his lipid profile was extremely not well controlled with a total cholesterol of 432, triglycerides of 1150.  However since then he has implemented lifestyle changes and has lost 47 pounds.  He also had recent blood work which I do not have for review but he will bring it in at the next office visit.  Patient states that he plans to go live in [place] for at least 1 year with his family and prior to that would like to undergo cardiovascular evaluation.  Echocardiogram will be ordered to evaluate for structural heart disease and left ventricular systolic function.  Given his multiple cardiovascular risk factors and elevated estimated 10-year risk of ASCVD recommend a coronary CTA for further risk stratification.  We will start Lopressor prior to his coronary CTA and will check BMP to evaluate his kidney function.  FINAL MEDICATION LIST END OF ENCOUNTER: Meds ordered this encounter  Medications   metoprolol tartrate (LOPRESSOR) 50 MG tablet    Sig: Take 1 tablet (50 mg total) by mouth 2 (two) times daily.    Dispense:  60 tablet    Refill:  0     Medications Discontinued During This Encounter  Medication Reason   famotidine (PEPCID) 20 MG tablet Error   HYDROcodone-acetaminophen (NORCO) 5-325 MG tablet Error   oxyCODONE-acetaminophen (PERCOCET/ROXICET) 5-325 MG tablet Error   pantoprazole (PROTONIX) 40 MG tablet Error     Current Outpatient Medications:    fenofibrate 160 MG tablet, Take 1 tablet (160 mg total) by mouth daily., Disp: 30 tablet, Rfl: 0   metFORMIN (GLUCOPHAGE) 500 MG tablet, Take 500 mg by mouth 2 (two) times daily., Disp: , Rfl:    metoprolol tartrate (LOPRESSOR) 50 MG tablet, Take 1 tablet (50 mg total) by mouth 2 (two) times daily., Disp: 60 tablet, Rfl: 0   phentermine (ADIPEX-P) 37.5 MG tablet, Take 37.5 mg by mouth daily., Disp: , Rfl:    rosuvastatin (CRESTOR) 20 MG  tablet, Take 20 mg by mouth daily., Disp: , Rfl:   Orders Placed This Encounter  Procedures   CT CORONARY MORPH W/CTA COR W/SCORE W/CA W/CM &/OR WO/CM   Basic metabolic panel   EKG 12-Lead   PCV ECHOCARDIOGRAM COMPLETE    There are no Patient Instructions on file for this visit.   --Continue cardiac medications as reconciled in final medication list. --Return in about 3 weeks (around 05/02/2021) for Review test results. Or sooner if needed. --Continue follow-up with your primary care physician regarding  the management of your other chronic comorbid conditions.  Patient's questions and concerns were addressed to his satisfaction. He voices understanding of the instructions provided during this encounter.   This note was created using a voice recognition software as a result there may be grammatical errors inadvertently enclosed that do not reflect the nature of this encounter. Every attempt is made to correct such errors.  Tessa LernerSunit Dollie Mayse, [place]DO, [facility] Community HospitalFACC  Pager: [phone] Office: [phone]

## 2021-04-11 ENCOUNTER — Ambulatory Visit: Payer: 59 | Admitting: Cardiology

## 2021-04-11 ENCOUNTER — Encounter: Payer: Self-pay | Admitting: Cardiology

## 2021-04-11 ENCOUNTER — Other Ambulatory Visit: Payer: Self-pay | Admitting: Cardiology

## 2021-04-11 ENCOUNTER — Other Ambulatory Visit: Payer: Self-pay

## 2021-04-11 VITALS — BP 142/82 | HR 78 | Temp 99.3°F | Resp 16 | Ht 72.0 in | Wt 236.0 lb

## 2021-04-11 DIAGNOSIS — E781 Pure hyperglyceridemia: Secondary | ICD-10-CM

## 2021-04-11 DIAGNOSIS — Z8249 Family history of ischemic heart disease and other diseases of the circulatory system: Secondary | ICD-10-CM

## 2021-04-11 DIAGNOSIS — E119 Type 2 diabetes mellitus without complications: Secondary | ICD-10-CM

## 2021-04-11 DIAGNOSIS — E7800 Pure hypercholesterolemia, unspecified: Secondary | ICD-10-CM

## 2021-04-11 MED ORDER — METOPROLOL TARTRATE 50 MG PO TABS: 50.0000 mg | ORAL_TABLET | Freq: Two times a day (BID) | ORAL | 0 refills | Status: DC

## 2021-04-13 ENCOUNTER — Ambulatory Visit: Payer: 59

## 2021-04-13 ENCOUNTER — Other Ambulatory Visit: Payer: Self-pay

## 2021-04-13 DIAGNOSIS — Z8249 Family history of ischemic heart disease and other diseases of the circulatory system: Secondary | ICD-10-CM

## 2021-04-18 NOTE — Progress Notes (Signed)
Called patient, NA, LMAM

## 2021-04-19 NOTE — Progress Notes (Signed)
Called and spoke with patient regarding his echocardiogram results.  ?

## 2021-05-02 ENCOUNTER — Ambulatory Visit: Payer: 59 | Admitting: Cardiology

## 2021-05-02 DIAGNOSIS — Z8249 Family history of ischemic heart disease and other diseases of the circulatory system: Secondary | ICD-10-CM

## 2021-05-02 DIAGNOSIS — E781 Pure hyperglyceridemia: Secondary | ICD-10-CM

## 2021-05-02 DIAGNOSIS — E7800 Pure hypercholesterolemia, unspecified: Secondary | ICD-10-CM

## 2021-05-02 DIAGNOSIS — E119 Type 2 diabetes mellitus without complications: Secondary | ICD-10-CM

## 2021-07-07 ENCOUNTER — Other Ambulatory Visit: Payer: Self-pay | Admitting: Cardiology

## 2021-07-07 DIAGNOSIS — Z8249 Family history of ischemic heart disease and other diseases of the circulatory system: Secondary | ICD-10-CM

## 2022-07-19 ENCOUNTER — Other Ambulatory Visit: Payer: Self-pay

## 2022-07-19 ENCOUNTER — Emergency Department (HOSPITAL_COMMUNITY)
Admission: EM | Admit: 2022-07-19 | Discharge: 2022-07-20 | Disposition: A | Discharge status: Home / Self Care | Payer: 59 | Attending: Emergency Medicine | Admitting: Emergency Medicine

## 2022-07-19 ENCOUNTER — Encounter (HOSPITAL_COMMUNITY): Payer: Self-pay

## 2022-07-19 ENCOUNTER — Emergency Department (HOSPITAL_COMMUNITY): Payer: 59

## 2022-07-19 DIAGNOSIS — J189 Pneumonia, unspecified organism: Secondary | ICD-10-CM | POA: Diagnosis not present

## 2022-07-19 DIAGNOSIS — Z20822 Contact with and (suspected) exposure to covid-19: Secondary | ICD-10-CM | POA: Insufficient documentation

## 2022-07-19 DIAGNOSIS — R509 Fever, unspecified: Secondary | ICD-10-CM | POA: Diagnosis present

## 2022-07-19 LAB — COMPREHENSIVE METABOLIC PANEL
ALT: 39 U/L (ref 0–44)
AST: 38 U/L (ref 15–41)
Albumin: 3.6 g/dL (ref 3.5–5.0)
Alkaline Phosphatase: 44 U/L (ref 38–126)
Anion gap: 6 (ref 5–15)
BUN: 14 mg/dL (ref 6–20)
CO2: 26 mmol/L (ref 22–32)
Calcium: 8.7 mg/dL — ABNORMAL LOW (ref 8.9–10.3)
Chloride: 103 mmol/L (ref 98–111)
Creatinine, Ser: 1.19 mg/dL (ref 0.61–1.24)
GFR, Estimated: >60 mL/min (ref >60)
Glucose, Bld: 113 mg/dL — ABNORMAL HIGH (ref 70–99)
Potassium: 3.9 mmol/L (ref 3.5–5.1)
Sodium: 135 mmol/L (ref 135–145)
Total Bilirubin: 0.8 mg/dL (ref 0.3–1.2)
Total Protein: 7.2 g/dL (ref 6.5–8.1)

## 2022-07-19 LAB — CBC WITH DIFFERENTIAL/PLATELET
Abs Immature Granulocytes: 0.02 10*3/uL (ref 0.00–0.07)
Basophils Absolute: 0 10*3/uL (ref 0.0–0.1)
Basophils Relative: 0 %
Eosinophils Absolute: 0 10*3/uL (ref 0.0–0.5)
Eosinophils Relative: 1 %
HCT: 41.7 % (ref 39.0–52.0)
Hemoglobin: 14.4 g/dL (ref 13.0–17.0)
Immature Granulocytes: 0 %
Lymphocytes Relative: 14 %
Lymphs Abs: 0.8 10*3/uL (ref 0.7–4.0)
MCH: 30.4 pg (ref 26.0–34.0)
MCHC: 34.5 g/dL (ref 30.0–36.0)
MCV: 88 fL (ref 80.0–100.0)
Monocytes Absolute: 0.4 10*3/uL (ref 0.1–1.0)
Monocytes Relative: 7 %
Neutro Abs: 4.7 10*3/uL (ref 1.7–7.7)
Neutrophils Relative %: 78 %
Platelets: 151 10*3/uL (ref 150–400)
RBC: 4.74 MIL/uL (ref 4.22–5.81)
RDW: 12.9 % (ref 11.5–15.5)
WBC: 6.1 10*3/uL (ref 4.0–10.5)
nRBC: 0 % (ref 0.0–0.2)

## 2022-07-19 LAB — RESP PANEL BY RT-PCR (FLU A&B, COVID) ARPGX2
Influenza A by PCR: NEGATIVE
Influenza B by PCR: NEGATIVE
SARS Coronavirus 2 by RT PCR: NEGATIVE

## 2022-07-19 MED ORDER — AZITHROMYCIN 250 MG PO TABS: 250.0000 mg | ORAL_TABLET | Freq: Every day | ORAL | 0 refills | Status: AC

## 2022-07-19 MED ORDER — IBUPROFEN 800 MG PO TABS
800.0000 mg | ORAL_TABLET | Freq: Once | ORAL | Status: AC
  Administered 2022-07-19: 800 mg via ORAL
  Filled 2022-07-19: qty 1

## 2022-07-19 MED ORDER — ACETAMINOPHEN 325 MG PO TABS
650.0000 mg | ORAL_TABLET | Freq: Once | ORAL | Status: AC
  Administered 2022-07-19: 650 mg via ORAL
  Filled 2022-07-19: qty 2

## 2022-07-19 MED ORDER — AZITHROMYCIN 250 MG PO TABS
500.0000 mg | ORAL_TABLET | Freq: Once | ORAL | Status: AC
  Administered 2022-07-20: 500 mg via ORAL
  Filled 2022-07-19: qty 2

## 2022-07-19 NOTE — Discharge Instructions (Signed)
Begin taking Zithromax as prescribed.  Take Tylenol 1000 mg rotated with ibuprofen 600 mg every 3-4 hours as needed for pain or fever.  Follow-up with primary doctor if not improving in the next few days, and return to the ER if symptoms significantly worsen or change.

## 2022-07-19 NOTE — ED Triage Notes (Signed)
Pov from home. Cc of fever 102 since Sunday. Headache and dizziness. Has been taking dayquil nyquil.  103.2 in triage Took mucinex cold/flu 2 hours ago.

## 2022-07-19 NOTE — ED Notes (Signed)
X-ray at bedside

## 2022-07-19 NOTE — ED Provider Notes (Signed)
[facility] EMERGENCY DEPARTMENT Provider Note   CSN: [id number] Arrival date & time: 07/19/22  2125     History  Chief Complaint  Patient presents with   Fever    Mark Silva is a 46 y.o. male.  Patient is a 46 year old male with past medical history of diverticulitis.  Patient presenting today with complaints of body aches, headache, fever, and feeling generally unwell for the past 3 days.  He does report some headache, but denies any neck pain or stiff neck.  He denies vomiting or diarrhea.  He denies urinary complaints.  He has been taking Tylenol, however the fever seems to persist.  He denies any ill contacts.  The history is provided by the patient.       Home Medications Prior to Admission medications   Medication Sig Start Date End Date Taking? Authorizing Provider  fenofibrate 160 MG tablet Take 1 tablet (160 mg total) by mouth daily. 01/25/19   Shelly Coss, MD  metFORMIN (GLUCOPHAGE) 500 MG tablet Take 500 mg by mouth 2 (two) times daily. 03/10/21   [provider]  metoprolol tartrate (LOPRESSOR) 50 MG tablet TAKE 1 TABLET(50 MG) BY MOUTH TWICE DAILY 07/07/21   Tolia, Sunit, DO  phentermine (ADIPEX-P) 37.5 MG tablet Take 37.5 mg by mouth daily. 04/09/21   [provider]  rosuvastatin (CRESTOR) 20 MG tablet Take 20 mg by mouth daily. 04/04/21   [provider]      Allergies    Food    Review of Systems   Review of Systems  All other systems reviewed and are negative.   Physical Exam Updated Vital Signs BP 122/73 (BP Location: Right Arm)   Pulse 90   Temp (!) 101.1 F (38.4 C) (Oral)   Resp 17   Ht 6' (1.829 m)   Wt 100.7 kg   SpO2 96%   BMI 30.11 kg/m  Physical Exam Vitals and nursing note reviewed.  Constitutional:      General: He is not in acute distress.    Appearance: He is well-developed. He is not diaphoretic.  HENT:     Head: Normocephalic and atraumatic.  Cardiovascular:     Rate and Rhythm: Normal rate  and regular rhythm.     Heart sounds: No murmur heard.    No friction rub.  Pulmonary:     Effort: Pulmonary effort is normal. No respiratory distress.     Breath sounds: Rales present. No wheezing.     Comments: There are slight rales in the left lower lung field. Abdominal:     General: Bowel sounds are normal. There is no distension.     Palpations: Abdomen is soft.     Tenderness: There is no abdominal tenderness.  Musculoskeletal:        General: Normal range of motion.     Cervical back: Normal range of motion and neck supple.  Skin:    General: Skin is warm and dry.  Neurological:     Mental Status: He is alert and oriented to person, place, and time.     Coordination: Coordination normal.     ED Results / Procedures / Treatments   Labs (all labs ordered are listed, but only abnormal results are displayed) Labs Reviewed  COMPREHENSIVE METABOLIC PANEL - Abnormal; Notable for the following components:      Result Value   Glucose, Bld 113 (*)    Calcium 8.7 (*)    All other components within normal limits  RESP PANEL  BY RT-PCR (FLU A&B, COVID) [id number]  CBC WITH DIFFERENTIAL/PLATELET    EKG None  Radiology DG Chest Port 1 View  Result Date: 07/19/2022 CLINICAL DATA:  Dyspnea, fever EXAM: PORTABLE CHEST 1 VIEW COMPARISON:  01/21/2019 FINDINGS: Focal left perihilar pulmonary infiltrate is present, possibly infectious or inflammatory in the acute setting. The lungs are otherwise clear. No pneumothorax or pleural effusion. Cardiac size within normal limits. Pulmonary vascularity is normal. No acute bone abnormality. IMPRESSION: 1. Left perihilar pulmonary infiltrate, possibly infectious or inflammatory in the acute setting. Electronically Signed   By: Helyn Numbers M.D.   On: 07/19/2022 22:17    Procedures Procedures    Medications Ordered in ED Medications  azithromycin (ZITHROMAX) tablet 500 mg (has no administration in time range)  ibuprofen (ADVIL) tablet 800 mg  (800 mg Oral Given 07/19/22 2200)  acetaminophen (TYLENOL) tablet 650 mg (650 mg Oral Given 07/19/22 2200)    ED Course/ Medical Decision Making/ A&P  Patient presenting with complaints of fever and body aches for the past 3 days.  He arrives here febrile with temp of 101.1, but vital signs are otherwise stable.  He is clinically well-appearing and in no distress.  There is no hypoxia.  Work-up initiated including CBC, metabolic panel, COVID/flu.  The studies were all unremarkable.  He did have a chest x-ray performed showing a left perihilar infiltrate.  Patient's symptoms most consistent with pneumonia.  He will be treated with Zithromax, continue Tylenol/Motrin, and return as needed if symptoms worsen or change.  Final Clinical Impression(s) / ED Diagnoses Final diagnoses:  None    Rx / DC Orders ED Discharge Orders     None         Geoffery Lyons, MD 07/19/22 2356

## 2022-12-14 ENCOUNTER — Other Ambulatory Visit (HOSPITAL_BASED_OUTPATIENT_CLINIC_OR_DEPARTMENT_OTHER): Payer: Self-pay

## 2022-12-14 MED ORDER — MOUNJARO 10 MG/0.5ML ~~LOC~~ SOAJ
10.0000 mg | SUBCUTANEOUS | 2 refills | Status: AC
  Filled 2022-12-14: qty 2, 28d supply, fill #0
  Filled 2023-01-22: qty 2, 28d supply, fill #1
  Filled 2023-02-05 – 2023-04-27 (×4): qty 2, 28d supply, fill #2

## 2023-01-22 ENCOUNTER — Other Ambulatory Visit (HOSPITAL_BASED_OUTPATIENT_CLINIC_OR_DEPARTMENT_OTHER): Payer: Self-pay

## 2023-02-05 ENCOUNTER — Other Ambulatory Visit (HOSPITAL_BASED_OUTPATIENT_CLINIC_OR_DEPARTMENT_OTHER): Payer: Self-pay

## 2023-02-12 ENCOUNTER — Other Ambulatory Visit (HOSPITAL_BASED_OUTPATIENT_CLINIC_OR_DEPARTMENT_OTHER): Payer: Self-pay

## 2023-02-16 ENCOUNTER — Other Ambulatory Visit (HOSPITAL_BASED_OUTPATIENT_CLINIC_OR_DEPARTMENT_OTHER): Payer: Self-pay

## 2023-02-24 ENCOUNTER — Other Ambulatory Visit (HOSPITAL_BASED_OUTPATIENT_CLINIC_OR_DEPARTMENT_OTHER): Payer: Self-pay

## 2023-02-24 MED ORDER — MOUNJARO 12.5 MG/0.5ML ~~LOC~~ SOAJ
12.5000 mg | SUBCUTANEOUS | 0 refills | Status: DC
  Filled 2023-02-24: qty 2, 28d supply, fill #0

## 2023-02-27 ENCOUNTER — Other Ambulatory Visit (HOSPITAL_BASED_OUTPATIENT_CLINIC_OR_DEPARTMENT_OTHER): Payer: Self-pay

## 2023-03-23 ENCOUNTER — Other Ambulatory Visit (HOSPITAL_BASED_OUTPATIENT_CLINIC_OR_DEPARTMENT_OTHER): Payer: Self-pay

## 2023-03-27 ENCOUNTER — Other Ambulatory Visit (HOSPITAL_BASED_OUTPATIENT_CLINIC_OR_DEPARTMENT_OTHER): Payer: Self-pay

## 2023-03-30 ENCOUNTER — Other Ambulatory Visit (HOSPITAL_BASED_OUTPATIENT_CLINIC_OR_DEPARTMENT_OTHER): Payer: Self-pay

## 2023-03-30 MED ORDER — TIRZEPATIDE 12.5 MG/0.5ML ~~LOC~~ SOAJ
12.5000 mg | SUBCUTANEOUS | 0 refills | Status: DC
  Filled 2023-03-30 – 2023-03-31 (×2): qty 2, 28d supply, fill #0

## 2023-03-31 ENCOUNTER — Other Ambulatory Visit (HOSPITAL_BASED_OUTPATIENT_CLINIC_OR_DEPARTMENT_OTHER): Payer: Self-pay

## 2023-04-02 ENCOUNTER — Other Ambulatory Visit (HOSPITAL_BASED_OUTPATIENT_CLINIC_OR_DEPARTMENT_OTHER): Payer: Self-pay

## 2023-04-07 ENCOUNTER — Other Ambulatory Visit (HOSPITAL_BASED_OUTPATIENT_CLINIC_OR_DEPARTMENT_OTHER): Payer: Self-pay

## 2023-04-07 MED ORDER — FLUOCINOLONE ACETONIDE 0.01 % OT OIL: 5.0000 [drp] | TOPICAL_OIL | Freq: Two times a day (BID) | OTIC | 0 refills | Status: AC

## 2023-04-09 ENCOUNTER — Other Ambulatory Visit (HOSPITAL_BASED_OUTPATIENT_CLINIC_OR_DEPARTMENT_OTHER): Payer: Self-pay

## 2023-04-09 ENCOUNTER — Emergency Department (HOSPITAL_BASED_OUTPATIENT_CLINIC_OR_DEPARTMENT_OTHER): Payer: BC Managed Care – PPO

## 2023-04-09 ENCOUNTER — Encounter (HOSPITAL_BASED_OUTPATIENT_CLINIC_OR_DEPARTMENT_OTHER): Payer: Self-pay | Admitting: Emergency Medicine

## 2023-04-09 ENCOUNTER — Other Ambulatory Visit: Payer: Self-pay

## 2023-04-09 ENCOUNTER — Emergency Department (HOSPITAL_BASED_OUTPATIENT_CLINIC_OR_DEPARTMENT_OTHER)
Admission: EM | Admit: 2023-04-09 | Discharge: 2023-04-09 | Disposition: A | Discharge status: Home / Self Care | Payer: BC Managed Care – PPO | Source: Home / Self Care | Attending: Emergency Medicine | Admitting: Emergency Medicine

## 2023-04-09 DIAGNOSIS — J039 Acute tonsillitis, unspecified: Secondary | ICD-10-CM | POA: Diagnosis not present

## 2023-04-09 DIAGNOSIS — M542 Cervicalgia: Secondary | ICD-10-CM | POA: Diagnosis present

## 2023-04-09 DIAGNOSIS — Z7984 Long term (current) use of oral hypoglycemic drugs: Secondary | ICD-10-CM | POA: Diagnosis not present

## 2023-04-09 DIAGNOSIS — Z79899 Other long term (current) drug therapy: Secondary | ICD-10-CM | POA: Diagnosis not present

## 2023-04-09 DIAGNOSIS — Z20822 Contact with and (suspected) exposure to covid-19: Secondary | ICD-10-CM | POA: Insufficient documentation

## 2023-04-09 LAB — CBC
HCT: 43.7 % (ref 39.0–52.0)
Hemoglobin: 15.4 g/dL (ref 13.0–17.0)
MCH: 30.9 pg (ref 26.0–34.0)
MCHC: 35.2 g/dL (ref 30.0–36.0)
MCV: 87.6 fL (ref 80.0–100.0)
Platelets: 161 10*3/uL (ref 150–400)
RBC: 4.99 MIL/uL (ref 4.22–5.81)
RDW: 13.1 % (ref 11.5–15.5)
WBC: 6.7 10*3/uL (ref 4.0–10.5)
nRBC: 0 % (ref 0.0–0.2)

## 2023-04-09 LAB — BASIC METABOLIC PANEL
Anion gap: 7 (ref 5–15)
BUN: 17 mg/dL (ref 6–20)
CO2: 29 mmol/L (ref 22–32)
Calcium: 9.5 mg/dL (ref 8.9–10.3)
Chloride: 105 mmol/L (ref 98–111)
Creatinine, Ser: 0.87 mg/dL (ref 0.61–1.24)
GFR, Estimated: >60 mL/min (ref >60)
Glucose, Bld: 97 mg/dL (ref 70–99)
Potassium: 4 mmol/L (ref 3.5–5.1)
Sodium: 141 mmol/L (ref 135–145)

## 2023-04-09 LAB — SARS CORONAVIRUS 2 BY RT PCR: SARS Coronavirus 2 by RT PCR: NEGATIVE

## 2023-04-09 LAB — GROUP A STREP BY PCR: Group A Strep by PCR: NOT DETECTED

## 2023-04-09 MED ORDER — KETOROLAC TROMETHAMINE 15 MG/ML IJ SOLN
15.0000 mg | Freq: Once | INTRAMUSCULAR | Status: AC
  Administered 2023-04-09: 15 mg via INTRAVENOUS
  Filled 2023-04-09: qty 1

## 2023-04-09 MED ORDER — IOHEXOL 300 MG/ML  SOLN
100.0000 mL | Freq: Once | INTRAMUSCULAR | Status: AC | PRN
  Administered 2023-04-09: 75 mL via INTRAVENOUS

## 2023-04-09 MED ORDER — ONDANSETRON HCL 4 MG/2ML IJ SOLN
4.0000 mg | Freq: Once | INTRAMUSCULAR | Status: AC
  Administered 2023-04-09: 4 mg via INTRAVENOUS
  Filled 2023-04-09: qty 2

## 2023-04-09 MED ORDER — MORPHINE SULFATE (PF) 4 MG/ML IV SOLN
4.0000 mg | Freq: Once | INTRAVENOUS | Status: AC
  Administered 2023-04-09: 4 mg via INTRAVENOUS
  Filled 2023-04-09: qty 1

## 2023-04-09 MED ORDER — AMOXICILLIN-POT CLAVULANATE 875-125 MG PO TABS
1.0000 | ORAL_TABLET | Freq: Two times a day (BID) | ORAL | 0 refills | Status: AC
  Filled 2023-04-09: qty 14, 7d supply, fill #0

## 2023-04-09 MED ORDER — DEXAMETHASONE SODIUM PHOSPHATE 10 MG/ML IJ SOLN
10.0000 mg | Freq: Once | INTRAMUSCULAR | Status: AC
  Administered 2023-04-09: 10 mg via INTRAVENOUS
  Filled 2023-04-09: qty 1

## 2023-04-09 MED ORDER — LACTATED RINGERS IV BOLUS
1000.0000 mL | Freq: Once | INTRAVENOUS | Status: AC
  Administered 2023-04-09: 1000 mL via INTRAVENOUS

## 2023-04-09 NOTE — ED Provider Notes (Signed)
[place] EMERGENCY DEPARTMENT AT [facility] Provider Note   CSN: [id number] Arrival date & time: 04/09/23  1006     History  Chief Complaint  Patient presents with   Sore Throat   Otalgia   Dysphagia    Mark Silva is a 47 y.o. male.  47 year old male presents today for evaluation of ear pain that has progressed to right-sided neck pain, and swelling.  Endorses difficulty swallowing, and voice change.  Reports low-grade fever the past 2 evenings.  States this morning he had difficulty swallowing.  States he went to urgent care but did not have much done for him.  Decreased p.o. intake the past couple days.  The history is provided by the patient. No language interpreter was used.       Home Medications Prior to Admission medications   Medication Sig Start Date End Date Taking? Authorizing Provider  azithromycin (ZITHROMAX) 250 MG tablet Take 1 tablet (250 mg total) by mouth daily. 07/19/22   Geoffery Lyons, MD  fenofibrate 160 MG tablet Take 1 tablet (160 mg total) by mouth daily. 01/25/19   Burnadette Pop, MD  Fluocinolone Acetonide 0.01 % OIL Place 5 drops into the affected ear 2 (two) times daily for 7 days. 04/07/23 04/14/23    metFORMIN (GLUCOPHAGE) 500 MG tablet Take 500 mg by mouth 2 (two) times daily. 03/10/21   [provider]  metoprolol tartrate (LOPRESSOR) 50 MG tablet TAKE 1 TABLET(50 MG) BY MOUTH TWICE DAILY 07/07/21   Tolia, Sunit, DO  phentermine (ADIPEX-P) 37.5 MG tablet Take 37.5 mg by mouth daily. 04/09/21   [provider]  rosuvastatin (CRESTOR) 20 MG tablet Take 20 mg by mouth daily. 04/04/21   [provider]  tirzepatide Greggory Keen) 10 MG/0.5ML Pen Inject 10 mg into the skin once a week. 12/14/22     tirzepatide (MOUNJARO) 12.5 MG/0.5ML Pen Inject 12.5 mg into the skin once a week. 02/24/23         Allergies    Food    Review of Systems   Review of Systems  Constitutional:  Negative for chills and fever.  HENT:   Positive for ear pain, sore throat, trouble swallowing and voice change.   All other systems reviewed and are negative.   Physical Exam Updated Vital Signs BP (!) 149/96 (BP Location: Right Arm)   Pulse 81   Temp 97.6 F (36.4 C) (Temporal)   Resp 15   Ht 6' (1.829 m)   Wt 97.1 kg   SpO2 98%   BMI 29.02 kg/m  Physical Exam Vitals and nursing note reviewed.  Constitutional:      General: He is not in acute distress.    Appearance: Normal appearance. He is not ill-appearing.  HENT:     Head: Normocephalic and atraumatic.     Nose: Nose normal.  Eyes:     General: No scleral icterus.    Extraocular Movements: Extraocular movements intact.     Conjunctiva/sclera: Conjunctivae normal.  Cardiovascular:     Rate and Rhythm: Normal rate and regular rhythm.     Heart sounds: Normal heart sounds.  Pulmonary:     Effort: Pulmonary effort is normal. No respiratory distress.     Breath sounds: Normal breath sounds. No wheezing or rales.  Musculoskeletal:        General: Normal range of motion.     Cervical back: Normal range of motion.  Skin:    General: Skin is warm and dry.  Neurological:  General: No focal deficit present.     Mental Status: He is alert. Mental status is at baseline.     ED Results / Procedures / Treatments   Labs (all labs ordered are listed, but only abnormal results are displayed) Labs Reviewed  SARS CORONAVIRUS 2 BY RT PCR  GROUP A STREP BY PCR  CBC  BASIC METABOLIC PANEL    EKG None  Radiology No results found.  Procedures Procedures    Medications Ordered in ED Medications  lactated ringers bolus 1,000 mL (has no administration in time range)  ketorolac (TORADOL) 15 MG/ML injection 15 mg (has no administration in time range)    ED Course/ Medical Decision Making/ A&P                             Medical Decision Making Amount and/or Complexity of Data Reviewed Labs: ordered. Radiology: ordered.  Risk Prescription drug  management.   47 year old male presents today for concern of ear pain, neck pain, trouble swallowing, and voice change.  Symptoms ongoing for the past several days but worse today.  Low-grade fever the past few nights.  Will provide fluid bolus, obtain labs, and obtain CT soft tissue neck.  CT soft tissue neck with evidence of tonsillitis.  10 mg of Decadron given.  Augmentin prescribed.  CBC, BMP unremarkable.  COVID-negative.  Group A strep negative.  Patient discharged in stable condition.  Return precautions discussed.  Patient voices understanding and is in agreement with plan.  Final Clinical Impression(s) / ED Diagnoses Final diagnoses:  Tonsillitis    Rx / DC Orders ED Discharge Orders          Ordered    amoxicillin-clavulanate (AUGMENTIN) 875-125 MG tablet  Every 12 hours        04/09/23 1306              Marita Kansas, PA-C 04/09/23 1324    Cathren Laine, MD 04/09/23 1356

## 2023-04-09 NOTE — Discharge Instructions (Addendum)
Your CT scan showed evidence of tonsillitis.  No abscess.  You received a dose of steroid in the emergency department.  Antibiotics sent into your pharmacy.  If you have any concerning symptoms such as inability to swallow please return to the emergency department otherwise follow-up with your primary care provider.  Drink plenty of fluids.  Take Tylenol and ibuprofen as needed for pain control.  You can take 1000 mg of Tylenol every 8 hours.  You can take 600 to 800 mg of ibuprofen every 6-8 hours.  Take ibuprofen particularly the 800 mg dose on the full stomach.

## 2023-04-09 NOTE — ED Triage Notes (Signed)
Pt arrive sto ED with c/o right sided otalgia x4 days, sore throat x3 days.

## 2023-04-26 ENCOUNTER — Other Ambulatory Visit (HOSPITAL_BASED_OUTPATIENT_CLINIC_OR_DEPARTMENT_OTHER): Payer: Self-pay

## 2023-04-27 ENCOUNTER — Other Ambulatory Visit (HOSPITAL_BASED_OUTPATIENT_CLINIC_OR_DEPARTMENT_OTHER): Payer: Self-pay

## 2023-04-28 ENCOUNTER — Other Ambulatory Visit (HOSPITAL_BASED_OUTPATIENT_CLINIC_OR_DEPARTMENT_OTHER): Payer: Self-pay

## 2023-04-28 MED ORDER — TIRZEPATIDE 12.5 MG/0.5ML ~~LOC~~ SOAJ
12.5000 mg | SUBCUTANEOUS | 0 refills | Status: AC
  Filled 2023-04-28 – 2023-05-28 (×2): qty 2, 28d supply, fill #0

## 2023-04-30 ENCOUNTER — Other Ambulatory Visit (HOSPITAL_BASED_OUTPATIENT_CLINIC_OR_DEPARTMENT_OTHER): Payer: Self-pay

## 2023-05-02 ENCOUNTER — Other Ambulatory Visit (HOSPITAL_BASED_OUTPATIENT_CLINIC_OR_DEPARTMENT_OTHER): Payer: Self-pay

## 2023-05-28 ENCOUNTER — Other Ambulatory Visit (HOSPITAL_BASED_OUTPATIENT_CLINIC_OR_DEPARTMENT_OTHER): Payer: Self-pay

## 2023-06-30 ENCOUNTER — Other Ambulatory Visit (HOSPITAL_BASED_OUTPATIENT_CLINIC_OR_DEPARTMENT_OTHER): Payer: Self-pay

## 2023-06-30 MED ORDER — MOUNJARO 15 MG/0.5ML ~~LOC~~ SOAJ
SUBCUTANEOUS | 3 refills | Status: DC
  Filled 2023-06-30: qty 2, 28d supply, fill #0
  Filled 2023-07-23: qty 2, 28d supply, fill #1
  Filled 2023-09-06: qty 2, 28d supply, fill #2
  Filled 2023-10-17: qty 2, 28d supply, fill #3

## 2023-07-07 ENCOUNTER — Other Ambulatory Visit (HOSPITAL_BASED_OUTPATIENT_CLINIC_OR_DEPARTMENT_OTHER): Payer: Self-pay

## 2023-07-07 MED ORDER — ROSUVASTATIN CALCIUM 10 MG PO TABS
10.0000 mg | ORAL_TABLET | Freq: Every day | ORAL | 1 refills | Status: AC
  Filled 2023-07-07 – 2023-07-23 (×2): qty 90, 90d supply, fill #0
  Filled 2023-10-17: qty 90, 90d supply, fill #1

## 2023-07-17 ENCOUNTER — Other Ambulatory Visit (HOSPITAL_BASED_OUTPATIENT_CLINIC_OR_DEPARTMENT_OTHER): Payer: Self-pay

## 2023-07-23 ENCOUNTER — Other Ambulatory Visit (HOSPITAL_BASED_OUTPATIENT_CLINIC_OR_DEPARTMENT_OTHER): Payer: Self-pay

## 2023-08-02 ENCOUNTER — Other Ambulatory Visit: Payer: Self-pay

## 2023-08-02 ENCOUNTER — Emergency Department (HOSPITAL_BASED_OUTPATIENT_CLINIC_OR_DEPARTMENT_OTHER)
Admission: EM | Admit: 2023-08-02 | Discharge: 2023-08-02 | Disposition: A | Discharge status: Home / Self Care | Payer: BC Managed Care – PPO | Attending: Emergency Medicine | Admitting: Emergency Medicine

## 2023-08-02 ENCOUNTER — Emergency Department (HOSPITAL_BASED_OUTPATIENT_CLINIC_OR_DEPARTMENT_OTHER): Payer: BC Managed Care – PPO

## 2023-08-02 ENCOUNTER — Encounter (HOSPITAL_BASED_OUTPATIENT_CLINIC_OR_DEPARTMENT_OTHER): Payer: Self-pay | Admitting: Emergency Medicine

## 2023-08-02 DIAGNOSIS — E119 Type 2 diabetes mellitus without complications: Secondary | ICD-10-CM | POA: Diagnosis not present

## 2023-08-02 DIAGNOSIS — Z7984 Long term (current) use of oral hypoglycemic drugs: Secondary | ICD-10-CM | POA: Diagnosis not present

## 2023-08-02 DIAGNOSIS — N281 Cyst of kidney, acquired: Secondary | ICD-10-CM | POA: Insufficient documentation

## 2023-08-02 DIAGNOSIS — N201 Calculus of ureter: Secondary | ICD-10-CM

## 2023-08-02 DIAGNOSIS — N132 Hydronephrosis with renal and ureteral calculous obstruction: Secondary | ICD-10-CM | POA: Insufficient documentation

## 2023-08-02 DIAGNOSIS — R1031 Right lower quadrant pain: Secondary | ICD-10-CM | POA: Diagnosis present

## 2023-08-02 LAB — URINALYSIS, ROUTINE W REFLEX MICROSCOPIC
Bacteria, UA: NONE SEEN
Glucose, UA: NEGATIVE mg/dL
Ketones, ur: NEGATIVE mg/dL
Leukocytes,Ua: NEGATIVE
Nitrite: POSITIVE — AB
Protein, ur: NEGATIVE mg/dL
Specific Gravity, Urine: 1.028 (ref 1.005–1.030)
pH: 6 (ref 5.0–8.0)

## 2023-08-02 LAB — CBC WITH DIFFERENTIAL/PLATELET
Abs Immature Granulocytes: 0.03 10*3/uL (ref 0.00–0.07)
Basophils Absolute: 0 10*3/uL (ref 0.0–0.1)
Basophils Relative: 0 %
Eosinophils Absolute: 0.1 10*3/uL (ref 0.0–0.5)
Eosinophils Relative: 1 %
HCT: 45 % (ref 39.0–52.0)
Hemoglobin: 16.2 g/dL (ref 13.0–17.0)
Immature Granulocytes: 0 %
Lymphocytes Relative: 16 %
Lymphs Abs: 1.7 10*3/uL (ref 0.7–4.0)
MCH: 30.8 pg (ref 26.0–34.0)
MCHC: 36 g/dL (ref 30.0–36.0)
MCV: 85.6 fL (ref 80.0–100.0)
Monocytes Absolute: 0.9 10*3/uL (ref 0.1–1.0)
Monocytes Relative: 9 %
Neutro Abs: 7.9 10*3/uL — ABNORMAL HIGH (ref 1.7–7.7)
Neutrophils Relative %: 74 %
Platelets: 208 10*3/uL (ref 150–400)
RBC: 5.26 MIL/uL (ref 4.22–5.81)
RDW: 12.9 % (ref 11.5–15.5)
WBC: 10.6 10*3/uL — ABNORMAL HIGH (ref 4.0–10.5)
nRBC: 0 % (ref 0.0–0.2)

## 2023-08-02 LAB — COMPREHENSIVE METABOLIC PANEL
ALT: 19 U/L (ref 0–44)
AST: 25 U/L (ref 15–41)
Albumin: 4.4 g/dL (ref 3.5–5.0)
Alkaline Phosphatase: 49 U/L (ref 38–126)
Anion gap: 8 (ref 5–15)
BUN: 23 mg/dL — ABNORMAL HIGH (ref 6–20)
CO2: 28 mmol/L (ref 22–32)
Calcium: 10.3 mg/dL (ref 8.9–10.3)
Chloride: 102 mmol/L (ref 98–111)
Creatinine, Ser: 1.77 mg/dL — ABNORMAL HIGH (ref 0.61–1.24)
GFR, Estimated: 47 mL/min — ABNORMAL LOW (ref >60)
Glucose, Bld: 98 mg/dL (ref 70–99)
Potassium: 4 mmol/L (ref 3.5–5.1)
Sodium: 138 mmol/L (ref 135–145)
Total Bilirubin: 1.1 mg/dL (ref <1.2)
Total Protein: 7.3 g/dL (ref 6.5–8.1)

## 2023-08-02 LAB — LIPASE, BLOOD: Lipase: 28 U/L (ref 11–51)

## 2023-08-02 MED ORDER — SODIUM CHLORIDE 0.9 % IV BOLUS
1000.0000 mL | Freq: Once | INTRAVENOUS | Status: AC
  Administered 2023-08-02: 1000 mL via INTRAVENOUS

## 2023-08-02 MED ORDER — KETOROLAC TROMETHAMINE 30 MG/ML IJ SOLN
15.0000 mg | Freq: Once | INTRAMUSCULAR | Status: AC
  Administered 2023-08-02: 15 mg via INTRAVENOUS
  Filled 2023-08-02: qty 1

## 2023-08-02 MED ORDER — IOHEXOL 300 MG/ML  SOLN
100.0000 mL | Freq: Once | INTRAMUSCULAR | Status: AC | PRN
  Administered 2023-08-02: 100 mL via INTRAVENOUS

## 2023-08-02 MED ORDER — CEFDINIR 300 MG PO CAPS: 300.0000 mg | ORAL_CAPSULE | Freq: Two times a day (BID) | ORAL | 0 refills | Status: AC

## 2023-08-02 MED ORDER — OXYCODONE-ACETAMINOPHEN 5-325 MG PO TABS: 1.0000 | ORAL_TABLET | Freq: Four times a day (QID) | ORAL | 0 refills | Status: AC | PRN

## 2023-08-02 MED ORDER — MORPHINE SULFATE (PF) 4 MG/ML IV SOLN
4.0000 mg | Freq: Once | INTRAVENOUS | Status: AC
  Administered 2023-08-02: 4 mg via INTRAVENOUS
  Filled 2023-08-02: qty 1

## 2023-08-02 MED ORDER — ONDANSETRON HCL 4 MG/2ML IJ SOLN
4.0000 mg | Freq: Once | INTRAMUSCULAR | Status: AC
  Administered 2023-08-02: 4 mg via INTRAVENOUS
  Filled 2023-08-02: qty 2

## 2023-08-02 MED ORDER — TAMSULOSIN HCL 0.4 MG PO CAPS: 0.4000 mg | ORAL_CAPSULE | Freq: Every day | ORAL | 0 refills | Status: AC

## 2023-08-02 NOTE — Discharge Instructions (Addendum)
I have prescribed antibiotics, narcotic pain medication, and Flomax.  Please take all these as prescribed.  I would like for you to drink plenty of fluids.  Please follow-up with urology for further evaluation.  You may return to the emergency department for any worsening symptoms.

## 2023-08-02 NOTE — ED Provider Notes (Signed)
  Physical Exam  BP 123/76 (BP Location: Right Arm)   Pulse 63   Temp 98.6 F (37 C)   Resp 18   SpO2 95%   Physical Exam Vitals and nursing note reviewed.  Constitutional:      General: He is not in acute distress.    Appearance: Normal appearance.  HENT:     Head: Normocephalic and atraumatic.  Eyes:     General:        Right eye: No discharge.        Left eye: No discharge.  Cardiovascular:     Comments: Regular rate and rhythm.  S1/S2 are distinct without any evidence of murmur, rubs, or gallops.  Radial pulses are 2+ bilaterally.  Dorsalis pedis pulses are 2+ bilaterally.  No evidence of pedal edema. Pulmonary:     Comments: Clear to auscultation bilaterally.  Normal effort.  No respiratory distress.  No evidence of wheezes, rales, or rhonchi heard throughout. Abdominal:     General: Abdomen is flat. Bowel sounds are normal. There is no distension.     Tenderness: There is abdominal tenderness in the right lower quadrant. There is no guarding or rebound.  Musculoskeletal:        General: Normal range of motion.     Cervical back: Neck supple.  Skin:    General: Skin is warm and dry.     Findings: No rash.  Neurological:     General: No focal deficit present.     Mental Status: He is alert.  Psychiatric:        Mood and Affect: Mood normal.        Behavior: Behavior normal.     Procedures  Procedures  ED Course / MDM   Clinical Course as of 08/02/23 1918  Thu Aug 02, 2023  1503 CO2: 28 [JR]  1602 Leukocytes,Ua: NEGATIVE [JR]  1908 I spoke with Dr. Cardell Peach with urology who recommends Omnicef, Flomax and to call the office tomorrow to be seen.  I was less concerned about the AKI and he should be able to pass a stone on his own. [CF]    Clinical Course User Index [CF] Teressa Lower, PA-C [JR] Gareth Eagle, PA-C   Medical Decision Making Amount and/or Complexity of Data Reviewed Labs: ordered. Decision-making details documented in ED Course. Radiology:  ordered.  Risk Prescription drug management.   On reevaluation, patient is feeling much better.  As highlighted in ED course, I spoke with Dr. Cardell Peach with urology.  We will cover him with Truman Hayward, Flomax, and he will call the office tomorrow.  I will also give him some narcotic pain medication to cover for breakthrough pain.  Patient is agreeable with plan.  Strict return precautions were discussed.  He is safe for discharge at this time.       Teressa Lower, PA-C 08/02/23 1918    Melene Plan, DO 08/02/23 2011

## 2023-08-02 NOTE — Treatment Plan (Signed)
Urology treatment note:  Briefly, this is a 47 year old male with history of diverticulitis status post bowel resection, diabetes, hyperlipidemia, who presented to the ED with abdominal pain.  ED workup significant for CT scan showing 4 mm right distal UVJ stone.  Patient does have a little bit of an AKI, creatinine 1.77, up from baseline of 0.8.  His white blood cell count is at 10.  His UA is unremarkable except that it is nitrite positive.  He has not had any fevers or chills at home.  Based on clinical picture, reasonable for discharge home with medical expulsion therapy.  Patient should be counseled to have aggressive fluid intake, and should be started on Flomax 0.4 mg daily.  I anticipate that patient has approximately 60 to 70% chance to pass this on his own if pain is able to be controlled.  Patient should also have dedicated urine culture drawn before they discharge from the emergency department.  Empiric antibiotics, Omnicef, would be reasonable until urine culture results.  We instructed ED to have patient call our urology clinic in the morning for close follow-up later this week.  Discussed strict return precautions, patient should return if he has any new fevers, chills, intractable pain, etc.   Roby Lofts, MD Resident Physician [facility]

## 2023-08-02 NOTE — ED Triage Notes (Signed)
RLQ stabbing pain with nausea and vomiting, started last night

## 2023-08-02 NOTE — ED Provider Notes (Signed)
[place] EMERGENCY DEPARTMENT AT [facility] Provider Note   CSN: [id number] Arrival date & time: 08/02/23  1331     History  Chief Complaint  Patient presents with   Abdominal Pain   HPI Mark Silva is a 47 y.o. male with history of diverticulitis status post bowel resection 12 years ago, diabetes, hyperlipidemia, history of pancreatitis presenting for abdominal pain. Pain started acutely last night. Primarily in the right lower quadrant and radiates to the left lower quadrant.  Endorses associated nausea vomiting.  Emesis is nonbloody and nonbilious. States last bowel movement yesterday.  Also mentioned that in the last 24 hours he has had increased urinary frequency but denies other urinary symptoms. Concerning but also have a UTI.  Denies blood in the urine.   Abdominal Pain      Home Medications Prior to Admission medications   Medication Sig Start Date End Date Taking? Authorizing Provider  tamsulosin (FLOMAX) 0.4 MG CAPS capsule Take 1 capsule (0.4 mg total) by mouth daily. 08/02/23  Yes Gareth Eagle, PA-C  amoxicillin-clavulanate (AUGMENTIN) 875-125 MG tablet Take 1 tablet by mouth every 12 (twelve) hours. 04/09/23   Karie Mainland, Amjad, PA-C  azithromycin (ZITHROMAX) 250 MG tablet Take 1 tablet (250 mg total) by mouth daily. 07/19/22   Geoffery Lyons, MD  fenofibrate 160 MG tablet Take 1 tablet (160 mg total) by mouth daily. 01/25/19   Burnadette Pop, MD  metFORMIN (GLUCOPHAGE) 500 MG tablet Take 500 mg by mouth 2 (two) times daily. 03/10/21   [provider]  metoprolol tartrate (LOPRESSOR) 50 MG tablet TAKE 1 TABLET(50 MG) BY MOUTH TWICE DAILY 07/07/21   Tolia, Sunit, DO  phentermine (ADIPEX-P) 37.5 MG tablet Take 37.5 mg by mouth daily. 04/09/21   [provider]  rosuvastatin (CRESTOR) 10 MG tablet Take 1 tablet (10 mg total) by mouth daily. 07/07/23     rosuvastatin (CRESTOR) 20 MG tablet Take 20 mg by mouth daily. 04/04/21   [provider]  tirzepatide Greggory Keen) 10 MG/0.5ML Pen Inject 10 mg into the skin once a week. 12/14/22     tirzepatide (MOUNJARO) 12.5 MG/0.5ML Pen Inject 12.5 mg into the skin once a week. 02/24/23     tirzepatide (MOUNJARO) 15 MG/0.5ML Pen Take 15 mg (subcutaneous) every week 06/30/23         Allergies    Food    Review of Systems   Review of Systems  Gastrointestinal:  Positive for abdominal pain.    Physical Exam Updated Vital Signs BP 123/76 (BP Location: Right Arm)   Pulse 63   Temp 98.6 F (37 C)   Resp 18   SpO2 95%  Physical Exam Vitals and nursing note reviewed.  HENT:     Head: Normocephalic and atraumatic.     Mouth/Throat:     Mouth: Mucous membranes are moist.  Eyes:     General:        Right eye: No discharge.        Left eye: No discharge.     Conjunctiva/sclera: Conjunctivae normal.  Cardiovascular:     Rate and Rhythm: Normal rate and regular rhythm.     Pulses: Normal pulses.     Heart sounds: Normal heart sounds.  Pulmonary:     Effort: Pulmonary effort is normal.     Breath sounds: Normal breath sounds.  Abdominal:     General: Abdomen is flat.     Palpations: Abdomen is soft.     Tenderness: There is abdominal  tenderness in the right lower quadrant. There is right CVA tenderness.  Skin:    General: Skin is warm and dry.  Neurological:     General: No focal deficit present.  Psychiatric:        Mood and Affect: Mood normal.     ED Results / Procedures / Treatments   Labs (all labs ordered are listed, but only abnormal results are displayed) Labs Reviewed  CBC WITH DIFFERENTIAL/PLATELET - Abnormal; Notable for the following components:      Result Value   WBC 10.6 (*)    Neutro Abs 7.9 (*)    All other components within normal limits  COMPREHENSIVE METABOLIC PANEL - Abnormal; Notable for the following components:   BUN 23 (*)    Creatinine, Ser 1.77 (*)    GFR, Estimated 47 (*)    All other components within normal limits  URINALYSIS, ROUTINE W  REFLEX MICROSCOPIC - Abnormal; Notable for the following components:   Hgb urine dipstick MODERATE (*)    Bilirubin Urine SMALL (*)    Nitrite POSITIVE (*)    All other components within normal limits  LIPASE, BLOOD    EKG None  Radiology CT ABDOMEN PELVIS W CONTRAST  Result Date: 08/02/2023 CLINICAL DATA:  RLQ abdominal pain EXAM: CT ABDOMEN AND PELVIS WITH CONTRAST TECHNIQUE: Multidetector CT imaging of the abdomen and pelvis was performed using the standard protocol following bolus administration of intravenous contrast. RADIATION DOSE REDUCTION: This exam was performed according to the departmental dose-optimization program which includes automated exposure control, adjustment of the mA and/or kV according to patient size and/or use of iterative reconstruction technique. CONTRAST:  OMNIPAQUE IOHEXOL 300 MG/ML  SOLN COMPARISON:  CT AP 01/21/19 FINDINGS: Lower chest: Right basilar atelectasis. Hepatobiliary: Liver has a normal contour. No focal liver lesions are visualized. No evidence of intra or extrahepatic biliary ductal dilatation. Portal vein is contrast opacify. Pancreas: No evidence of peripancreatic fat stranding to suggest pancreatitis. No evidence of pancreatic ductal dilatation. Spleen: Spleen is normal in size without focal splenic lesions. Adrenals/Urinary Tract: Bilateral adrenal glands are normal in appearance. There is a large hypodense lesion in the interpolar region of the left kidney measuring up to 6 cm spinal favored to represent a simple renal cyst requiring no further imaging workup. Bilateral kidneys enhance homogeneously. There is asymmetrically decreased enhancement of the right kidney, suggestive of delayed nephrogram. There is moderate right-sided hydronephrosis secondary to a obstructing 4 mm renal stone in the distal right ureter (series 2, image 79). The urinary bladder is fluid-filled without focal abnormalities. Stomach/Bowel: The stomach, small bowel, large  bowel are normal in caliber. No evidence of bowel obstruction. There is mild diverticulosis without evidence of diverticulitis. The appendix normal in appearance. Vascular/Lymphatic: No significant vascular findings are present. No enlarged abdominal or pelvic lymph nodes. Reproductive: Prostate is unremarkable. Other: No abdominal wall hernia or abnormality. No abdominopelvic ascites. Musculoskeletal: No fracture is seen. IMPRESSION: Moderate right-sided hydronephrosis secondary to an obstructing 4 mm renal stone in the distal right ureter. Electronically Signed   By: Lorenza Cambridge M.D.   On: 08/02/2023 18:12    Procedures Procedures    Medications Ordered in ED Medications  morphine (PF) 4 MG/ML injection 4 mg (4 mg Intravenous Given 08/02/23 1402)  sodium chloride 0.9 % bolus 1,000 mL (0 mLs Intravenous Stopped 08/02/23 1734)  ondansetron (ZOFRAN) injection 4 mg (4 mg Intravenous Given 08/02/23 1400)  iohexol (OMNIPAQUE) 300 MG/ML solution 100 mL (100 mLs Intravenous Contrast  Given 08/02/23 1500)  morphine (PF) 4 MG/ML injection 4 mg (4 mg Intravenous Given 08/02/23 1519)  ketorolac (TORADOL) 30 MG/ML injection 15 mg (15 mg Intravenous Given 08/02/23 1810)    ED Course/ Medical Decision Making/ A&P Clinical Course as of 08/02/23 1858  Thu Aug 02, 2023  1503 CO2: 28 [JR]  1602 Leukocytes,Ua: NEGATIVE [JR]    Clinical Course User Index [JR] Gareth Eagle, PA-C                                 Medical Decision Making Amount and/or Complexity of Data Reviewed Labs: ordered. Decision-making details documented in ED Course. Radiology: ordered.  Risk Prescription drug management.   Initial Impression and Ddx 47 yo well appearing male presenting for abdominal pain. Exam notable for RLQ and rt CVA tenderness. Ddx includes kidney stone, kidney infection, urinary obstruction, appendicitis, diverticulitis, bowel obstruction, other. Patient PMH that increases complexity of ED encounter:   history of diverticulitis status post bowel resection 12 years ago, diabetes, hyperlipidemia, history of pancreatitis  Interpretation of Diagnostics - I independent reviewed and interpreted the labs as followed: reduced GFR, hematuria, + nitrites  - I independently visualized the following imaging with scope of interpretation limited to determining acute life threatening conditions related to emergency care: CT ab/pelvis, which revealed  moderate right-sided hydronephrosis secondary to an obstructing 4 mm renal stone in the distal right ureter and there is a large hypodense lesion in the interpolar region of the left kidney measuring up to 6 cm spinal favored to represent a simple renal cyst requiring  Patient Reassessment and Ultimate Disposition/Management Work up suggestive of ureteral stone with evidence of obstruction and possible associated infection. On reassessment, patient stated that pain was much improved. States that he was able to urinate multiple times during this encounter. Remains hemodynamically stable and nontoxic-appearing. Pending urology consult. Signed out to PA [employer].  Patient management required discussion with the following services or consulting groups: Urology  Complexity of Problems Addressed Acute complicated illness or Injury  Additional Data Reviewed and Analyzed Further history obtained from: Further history from spouse/family member, Past medical history and medications listed in the EMR, and Prior ED visit notes  Patient Encounter Risk Assessment Prescriptions         Final Clinical Impression(s) / ED Diagnoses Final diagnoses:  Calculus of ureter  Renal cyst    Rx / DC Orders ED Discharge Orders          Ordered    tamsulosin (FLOMAX) 0.4 MG CAPS capsule  Daily        08/02/23 1850              Gareth Eagle, PA-C 08/02/23 1858    Rozelle Logan, DO 08/07/23 [phone]

## 2023-08-04 LAB — URINE CULTURE: Culture: NO GROWTH

## 2023-09-06 ENCOUNTER — Other Ambulatory Visit (HOSPITAL_BASED_OUTPATIENT_CLINIC_OR_DEPARTMENT_OTHER): Payer: Self-pay

## 2023-10-17 ENCOUNTER — Other Ambulatory Visit (HOSPITAL_BASED_OUTPATIENT_CLINIC_OR_DEPARTMENT_OTHER): Payer: Self-pay

## 2023-11-10 ENCOUNTER — Encounter (HOSPITAL_BASED_OUTPATIENT_CLINIC_OR_DEPARTMENT_OTHER): Payer: Self-pay | Admitting: Emergency Medicine

## 2023-11-10 ENCOUNTER — Emergency Department (HOSPITAL_BASED_OUTPATIENT_CLINIC_OR_DEPARTMENT_OTHER)
Admission: EM | Admit: 2023-11-10 | Discharge: 2023-11-10 | Disposition: A | Discharge status: Home / Self Care | Payer: BC Managed Care – PPO | Attending: Emergency Medicine | Admitting: Emergency Medicine

## 2023-11-10 DIAGNOSIS — R509 Fever, unspecified: Secondary | ICD-10-CM | POA: Diagnosis not present

## 2023-11-10 DIAGNOSIS — R112 Nausea with vomiting, unspecified: Secondary | ICD-10-CM | POA: Diagnosis present

## 2023-11-10 DIAGNOSIS — Z20822 Contact with and (suspected) exposure to covid-19: Secondary | ICD-10-CM | POA: Diagnosis not present

## 2023-11-10 DIAGNOSIS — R197 Diarrhea, unspecified: Secondary | ICD-10-CM | POA: Insufficient documentation

## 2023-11-10 DIAGNOSIS — E860 Dehydration: Secondary | ICD-10-CM | POA: Diagnosis not present

## 2023-11-10 LAB — URINALYSIS, ROUTINE W REFLEX MICROSCOPIC
Bacteria, UA: NONE SEEN
Bilirubin Urine: NEGATIVE
Glucose, UA: NEGATIVE mg/dL
Hgb urine dipstick: NEGATIVE
Ketones, ur: NEGATIVE mg/dL
Leukocytes,Ua: NEGATIVE
Nitrite: NEGATIVE
Protein, ur: 30 mg/dL — AB
Specific Gravity, Urine: 1.035 — ABNORMAL HIGH (ref 1.005–1.030)
pH: 5.5 (ref 5.0–8.0)

## 2023-11-10 LAB — RESP PANEL BY RT-PCR (RSV, FLU A&B, COVID)  RVPGX2
Influenza A by PCR: NEGATIVE
Influenza B by PCR: NEGATIVE
Resp Syncytial Virus by PCR: NEGATIVE
SARS Coronavirus 2 by RT PCR: NEGATIVE

## 2023-11-10 LAB — CBC
HCT: 52.5 % — ABNORMAL HIGH (ref 39.0–52.0)
Hemoglobin: 18.8 g/dL — ABNORMAL HIGH (ref 13.0–17.0)
MCH: 30.4 pg (ref 26.0–34.0)
MCHC: 35.8 g/dL (ref 30.0–36.0)
MCV: 85 fL (ref 80.0–100.0)
Platelets: 195 10*3/uL (ref 150–400)
RBC: 6.18 MIL/uL — ABNORMAL HIGH (ref 4.22–5.81)
RDW: 12.5 % (ref 11.5–15.5)
WBC: 6.7 10*3/uL (ref 4.0–10.5)
nRBC: 0 % (ref 0.0–0.2)

## 2023-11-10 LAB — COMPREHENSIVE METABOLIC PANEL
ALT: 17 U/L (ref 0–44)
AST: 23 U/L (ref 15–41)
Albumin: 4.6 g/dL (ref 3.5–5.0)
Alkaline Phosphatase: 47 U/L (ref 38–126)
Anion gap: 9 (ref 5–15)
BUN: 28 mg/dL — ABNORMAL HIGH (ref 6–20)
CO2: 24 mmol/L (ref 22–32)
Calcium: 9.4 mg/dL (ref 8.9–10.3)
Chloride: 104 mmol/L (ref 98–111)
Creatinine, Ser: 1.11 mg/dL (ref 0.61–1.24)
GFR, Estimated: >60 mL/min (ref >60)
Glucose, Bld: 136 mg/dL — ABNORMAL HIGH (ref 70–99)
Potassium: 3.7 mmol/L (ref 3.5–5.1)
Sodium: 137 mmol/L (ref 135–145)
Total Bilirubin: 1 mg/dL (ref 0.0–1.2)
Total Protein: 7.7 g/dL (ref 6.5–8.1)

## 2023-11-10 LAB — LIPASE, BLOOD: Lipase: 24 U/L (ref 11–51)

## 2023-11-10 MED ORDER — ONDANSETRON HCL 4 MG/2ML IJ SOLN
4.0000 mg | Freq: Once | INTRAMUSCULAR | Status: AC
  Administered 2023-11-10: 4 mg via INTRAVENOUS
  Filled 2023-11-10: qty 2

## 2023-11-10 MED ORDER — ONDANSETRON 4 MG PO TBDP: 4.0000 mg | ORAL_TABLET | Freq: Four times a day (QID) | ORAL | 0 refills | Status: AC | PRN

## 2023-11-10 MED ORDER — SODIUM CHLORIDE 0.9 % IV BOLUS
1000.0000 mL | Freq: Once | INTRAVENOUS | Status: AC
  Administered 2023-11-10: 1000 mL via INTRAVENOUS

## 2023-11-10 MED ORDER — LOPERAMIDE HCL 2 MG PO CAPS: 2.0000 mg | ORAL_CAPSULE | Freq: Two times a day (BID) | ORAL | 0 refills | Status: AC | PRN

## 2023-11-10 NOTE — ED Triage Notes (Signed)
 Vomiting diarrhea body aches/ muscle spasm since Wednesday. Fever max 102 took advil around 3pm

## 2023-11-10 NOTE — ED Provider Notes (Signed)
 [place] EMERGENCY DEPARTMENT AT [facility] Provider Note   CSN: [id number] Arrival date & time: 11/10/23  1606     History  Chief Complaint  Patient presents with   Emesis    Mark Silva is a 48 y.o. male with past medical history significant for previous diverticulitis with partial colectomy, who presents with concern for vomiting, diarrhea, body aches, fatigue since Wednesday.  Reports fever max 102, took Advil today around 3 PM.  Reports he has not been able to keep anything down since Wednesday.  Reports 4-5 episodes of vomiting per day and 10-12 episodes of diarrhea.  Denies any blood in vomit or stool.   Emesis      Home Medications Prior to Admission medications   Medication Sig Start Date End Date Taking? Authorizing Provider  loperamide (IMODIUM) 2 MG capsule Take 1 capsule (2 mg total) by mouth 2 (two) times daily as needed for diarrhea or loose stools. 11/10/23  Yes Nickisha Hum H, PA-C  ondansetron (ZOFRAN-ODT) 4 MG disintegrating tablet Take 1 tablet (4 mg total) by mouth every 6 (six) hours as needed for nausea or vomiting. 11/10/23  Yes Hazel Wrinkle H, PA-C  amoxicillin-clavulanate (AUGMENTIN) 875-125 MG tablet Take 1 tablet by mouth every 12 (twelve) hours. 04/09/23   Karie Mainland, Amjad, PA-C  azithromycin (ZITHROMAX) 250 MG tablet Take 1 tablet (250 mg total) by mouth daily. 07/19/22   Geoffery Lyons, MD  cefdinir (OMNICEF) 300 MG capsule Take 1 capsule (300 mg total) by mouth 2 (two) times daily. 08/02/23   Honor Loh M, PA-C  fenofibrate 160 MG tablet Take 1 tablet (160 mg total) by mouth daily. 01/25/19   Burnadette Pop, MD  metFORMIN (GLUCOPHAGE) 500 MG tablet Take 500 mg by mouth 2 (two) times daily. 03/10/21   [provider]  metoprolol tartrate (LOPRESSOR) 50 MG tablet TAKE 1 TABLET(50 MG) BY MOUTH TWICE DAILY 07/07/21   Tolia, Sunit, DO  oxyCODONE-acetaminophen (PERCOCET/ROXICET) 5-325 MG tablet Take 1 tablet by mouth every 6  (six) hours as needed for severe pain (pain score 7-10). 08/02/23   Teressa Lower, PA-C  phentermine (ADIPEX-P) 37.5 MG tablet Take 37.5 mg by mouth daily. 04/09/21   [provider]  rosuvastatin (CRESTOR) 10 MG tablet Take 1 tablet (10 mg total) by mouth daily. 07/07/23     rosuvastatin (CRESTOR) 20 MG tablet Take 20 mg by mouth daily. 04/04/21   [provider]  tamsulosin (FLOMAX) 0.4 MG CAPS capsule Take 1 capsule (0.4 mg total) by mouth daily. 08/02/23   Gareth Eagle, PA-C  tirzepatide [facility]) 10 MG/0.5ML Pen Inject 10 mg into the skin once a week. 12/14/22     tirzepatide (MOUNJARO) 12.5 MG/0.5ML Pen Inject 12.5 mg into the skin once a week. 02/24/23     tirzepatide (MOUNJARO) 15 MG/0.5ML Pen Take 15 mg (subcutaneous) every week 06/30/23         Allergies    Food    Review of Systems   Review of Systems  Gastrointestinal:  Positive for vomiting.  All other systems reviewed and are negative.   Physical Exam Updated Vital Signs BP 111/83   Pulse 72   Temp 99.3 F (37.4 C) (Oral)   Resp 20   SpO2 99%  Physical Exam Vitals and nursing note reviewed.  Constitutional:      General: He is not in acute distress.    Appearance: Normal appearance.  HENT:     Head: Normocephalic and atraumatic.  Eyes:  General:        Right eye: No discharge.        Left eye: No discharge.  Cardiovascular:     Rate and Rhythm: Normal rate and regular rhythm.     Heart sounds: No murmur heard.    No friction rub. No gallop.  Pulmonary:     Effort: Pulmonary effort is normal.     Breath sounds: Normal breath sounds.  Abdominal:     General: Bowel sounds are normal.     Palpations: Abdomen is soft.     Comments: No significant tenderness to palpation throughout abdomen, normal bowel sounds throughout  Skin:    General: Skin is warm and dry.     Capillary Refill: Capillary refill takes less than 2 seconds.  Neurological:     Mental Status: He is alert and  oriented to person, place, and time.  Psychiatric:        Mood and Affect: Mood normal.        Behavior: Behavior normal.     ED Results / Procedures / Treatments   Labs (all labs ordered are listed, but only abnormal results are displayed) Labs Reviewed  CBC - Abnormal; Notable for the following components:      Result Value   RBC 6.18 (*)    Hemoglobin 18.8 (*)    HCT 52.5 (*)    All other components within normal limits  COMPREHENSIVE METABOLIC PANEL - Abnormal; Notable for the following components:   Glucose, Bld 136 (*)    BUN 28 (*)    All other components within normal limits  URINALYSIS, ROUTINE W REFLEX MICROSCOPIC - Abnormal; Notable for the following components:   Specific Gravity, Urine 1.035 (*)    Protein, ur 30 (*)    All other components within normal limits  RESP PANEL BY RT-PCR (RSV, FLU A&B, COVID)  RVPGX2  LIPASE, BLOOD    EKG None  Radiology No results found.  Procedures Procedures    Medications Ordered in ED Medications  sodium chloride 0.9 % bolus 1,000 mL ( Intravenous Stopped 11/10/23 1808)  ondansetron (ZOFRAN) injection 4 mg (4 mg Intravenous Given 11/10/23 1708)    ED Course/ Medical Decision Making/ A&P                                 Medical Decision Making Amount and/or Complexity of Data Reviewed Labs: ordered.  Risk Prescription drug management.   This patient is a 48 y.o. male  who presents to the ED for concern of nausea, vomiting, diarrhea, muscle aches.   Differential diagnoses prior to evaluation: The emergent differential diagnosis includes, but is not limited to,  The causes of generalized abdominal pain include but are not limited to AAA, mesenteric ischemia, appendicitis, diverticulitis, DKA, gastritis, gastroenteritis, AMI, nephrolithiasis, pancreatitis, peritonitis, adrenal insufficiency,lead poisoning, iron toxicity, intestinal ischemia, constipation, UTI,SBO/LBO, splenic rupture, biliary disease, IBD, IBS, PUD,  or hepatitis, considered gastroenteritis, gastritis, viral syndrome, versus other. This is not an exhaustive differential.   Past Medical History / Co-morbidities / Social History: Diverticulitis, pancreatitis, otherwise noncontributory  Physical Exam: Physical exam performed. The pertinent findings include: Borderline elevated temperature, 99.3 on arrival, blood pressure mildly elevated 132/95, blood pressure improved to 111/83 on recheck.  Patient feeling improved with no ongoing nausea, vomiting after fluids and nausea medication.  Lab Tests/Imaging studies: I personally interpreted labs/imaging and the pertinent results include: CBC with no leukocytosis, he  does have concentrated hemoglobin at 18.8 consistent with dehydration, CMP with elevated BUN, creatinine suspicious for dehydration, no acute AKI.  RVP is negative for COVID, flu, RSV, lipase unremarkable, UA is concentrated but otherwise overall unremarkable, small protein noted.  No evidence of infection..  Considered CT of the abdomen but given no tenderness to palpation on exam I do not think CT of the abdomen is needed at this time, symptoms are consistent with viral gastroenteritis versus gastritis, versus food poisoning, suspect self-limited, encouraged rehydration, discharged with nausea medication, patient understands and agrees to plan.   Medications: I ordered medication including fluids, nausea medication, will discharge with nausea medication, Imodium.  I have reviewed the patients home medicines and have made adjustments as needed.   Disposition: After consideration of the diagnostic results and the patients response to treatment, I feel that patient stable for discharge with plan as above.   emergency department workup does not suggest an emergent condition requiring admission or immediate intervention beyond what has been performed at this time. The plan is: as above. The patient is safe for discharge and has been instructed  to return immediately for worsening symptoms, change in symptoms or any other concerns.  Final Clinical Impression(s) / ED Diagnoses Final diagnoses:  Nausea vomiting and diarrhea  Dehydration    Rx / DC Orders ED Discharge Orders          Ordered    loperamide (IMODIUM) 2 MG capsule  2 times daily PRN        11/10/23 1831    ondansetron (ZOFRAN-ODT) 4 MG disintegrating tablet  Every 6 hours PRN        11/10/23 1831              Janeece Blok, Harrel Carina, PA-C 11/10/23 1834    Ashir, Kunz, DO 11/11/23 1223

## 2023-11-10 NOTE — Discharge Instructions (Addendum)
 You can use the Imodium tablets I prescribed twice daily to help decrease the amount of diarrhea that you are having, once your diarrhea has slowed down you should stop taking this medication.  Please drink plenty fluids including electrolyte containing fluid such as Pedialyte, Gatorade.  You can use the nausea medication I prescribed up to every 6 hours as needed for any persistent nausea, recommend taking it 30 minutes before you are going to try to eat.  Please return if you have significant worsening nausea, vomiting, diarrhea, or develop severe abdominal pain despite treatment.

## 2023-11-10 NOTE — ED Notes (Signed)
 Patient discharged to home after review of written discharge instructions; rx meds reviewed, understanding verbalized.  INT removed intact, pressure and dressing applied; Patient alert, oriented and self-ambulatory from ED, accompanied by family member

## 2023-11-10 NOTE — ED Notes (Signed)
 Fluids are finished, pt states nausea is relieved. Pt did have one episode of diarrhea x1 since here. Given gingerale. PA updated

## 2023-12-06 ENCOUNTER — Other Ambulatory Visit (HOSPITAL_BASED_OUTPATIENT_CLINIC_OR_DEPARTMENT_OTHER): Payer: Self-pay

## 2023-12-06 MED ORDER — TIRZEPATIDE 15 MG/0.5ML ~~LOC~~ SOAJ
15.0000 mg | SUBCUTANEOUS | 3 refills | Status: DC
  Filled 2023-12-06: qty 2, 28d supply, fill #0
  Filled 2024-02-08: qty 2, 28d supply, fill #1
  Filled 2024-03-03: qty 2, 28d supply, fill #2
  Filled 2024-03-27 – 2024-03-28 (×3): qty 2, 28d supply, fill #3

## 2023-12-07 ENCOUNTER — Other Ambulatory Visit (HOSPITAL_BASED_OUTPATIENT_CLINIC_OR_DEPARTMENT_OTHER): Payer: Self-pay

## 2023-12-12 ENCOUNTER — Other Ambulatory Visit (HOSPITAL_BASED_OUTPATIENT_CLINIC_OR_DEPARTMENT_OTHER): Payer: Self-pay

## 2024-02-08 ENCOUNTER — Other Ambulatory Visit (HOSPITAL_BASED_OUTPATIENT_CLINIC_OR_DEPARTMENT_OTHER): Payer: Self-pay

## 2024-03-03 ENCOUNTER — Other Ambulatory Visit (HOSPITAL_BASED_OUTPATIENT_CLINIC_OR_DEPARTMENT_OTHER): Payer: Self-pay

## 2024-03-27 ENCOUNTER — Other Ambulatory Visit (HOSPITAL_BASED_OUTPATIENT_CLINIC_OR_DEPARTMENT_OTHER): Payer: Self-pay

## 2024-03-28 ENCOUNTER — Other Ambulatory Visit (HOSPITAL_BASED_OUTPATIENT_CLINIC_OR_DEPARTMENT_OTHER): Payer: Self-pay

## 2024-04-28 ENCOUNTER — Other Ambulatory Visit (HOSPITAL_BASED_OUTPATIENT_CLINIC_OR_DEPARTMENT_OTHER): Payer: Self-pay

## 2024-04-29 ENCOUNTER — Other Ambulatory Visit (HOSPITAL_BASED_OUTPATIENT_CLINIC_OR_DEPARTMENT_OTHER): Payer: Self-pay

## 2024-04-29 MED ORDER — TIRZEPATIDE 15 MG/0.5ML ~~LOC~~ SOAJ
15.0000 mg | SUBCUTANEOUS | 3 refills | Status: DC
  Filled 2024-04-29: qty 2, 28d supply, fill #0
  Filled 2024-06-16: qty 2, 28d supply, fill #1

## 2024-06-16 ENCOUNTER — Other Ambulatory Visit (HOSPITAL_BASED_OUTPATIENT_CLINIC_OR_DEPARTMENT_OTHER): Payer: Self-pay

## 2024-06-16 ENCOUNTER — Other Ambulatory Visit: Payer: Self-pay

## 2024-07-29 ENCOUNTER — Other Ambulatory Visit: Payer: Self-pay

## 2024-07-30 ENCOUNTER — Other Ambulatory Visit: Payer: Self-pay

## 2024-07-30 ENCOUNTER — Other Ambulatory Visit (HOSPITAL_BASED_OUTPATIENT_CLINIC_OR_DEPARTMENT_OTHER): Payer: Self-pay

## 2024-07-30 ENCOUNTER — Other Ambulatory Visit (HOSPITAL_COMMUNITY): Payer: Self-pay

## 2024-07-30 MED ORDER — TIRZEPATIDE 15 MG/0.5ML ~~LOC~~ SOAJ
15.0000 mg | SUBCUTANEOUS | 3 refills | Status: AC
  Filled 2024-07-30: qty 2, 28d supply, fill #0

## 2024-07-31 ENCOUNTER — Other Ambulatory Visit (HOSPITAL_BASED_OUTPATIENT_CLINIC_OR_DEPARTMENT_OTHER): Payer: Self-pay

## 2024-07-31 MED ORDER — TIRZEPATIDE 15 MG/0.5ML ~~LOC~~ SOAJ
15.0000 mg | SUBCUTANEOUS | 3 refills | Status: DC
  Filled 2024-07-31: qty 2, 28d supply, fill #0

## 2024-08-06 ENCOUNTER — Other Ambulatory Visit (HOSPITAL_BASED_OUTPATIENT_CLINIC_OR_DEPARTMENT_OTHER): Payer: Self-pay

## 2024-08-12 ENCOUNTER — Other Ambulatory Visit (HOSPITAL_BASED_OUTPATIENT_CLINIC_OR_DEPARTMENT_OTHER): Payer: Self-pay

## 2024-08-13 ENCOUNTER — Other Ambulatory Visit (HOSPITAL_BASED_OUTPATIENT_CLINIC_OR_DEPARTMENT_OTHER): Payer: Self-pay

## 2024-08-13 MED ORDER — MOUNJARO 15 MG/0.5ML ~~LOC~~ SOAJ
15.0000 mg | SUBCUTANEOUS | 5 refills | Status: AC
  Filled 2024-08-13: qty 2, 28d supply, fill #0
  Filled 2024-09-23: qty 2, 28d supply, fill #1
  Filled 2024-10-24: qty 2, 28d supply, fill #2

## 2024-09-23 ENCOUNTER — Other Ambulatory Visit (HOSPITAL_BASED_OUTPATIENT_CLINIC_OR_DEPARTMENT_OTHER): Payer: Self-pay

## 2024-10-24 ENCOUNTER — Other Ambulatory Visit (HOSPITAL_BASED_OUTPATIENT_CLINIC_OR_DEPARTMENT_OTHER): Payer: Self-pay
